# Patient Record
Sex: Female | Born: 1990
Health system: Southern US, Community
[De-identification: ages and names within clinical notes are randomized; demographics above are authoritative.]

## PROBLEM LIST (undated history)

## (undated) DIAGNOSIS — L309 Dermatitis, unspecified: Secondary | ICD-10-CM

## (undated) DIAGNOSIS — N979 Female infertility, unspecified: Secondary | ICD-10-CM

## (undated) DIAGNOSIS — F419 Anxiety disorder, unspecified: Secondary | ICD-10-CM

## (undated) DIAGNOSIS — L709 Acne, unspecified: Secondary | ICD-10-CM

## (undated) DIAGNOSIS — R030 Elevated blood-pressure reading, without diagnosis of hypertension: Secondary | ICD-10-CM

## (undated) DIAGNOSIS — Z973 Presence of spectacles and contact lenses: Secondary | ICD-10-CM

## (undated) DIAGNOSIS — T7840XA Allergy, unspecified, initial encounter: Secondary | ICD-10-CM

## (undated) DIAGNOSIS — E0590 Thyrotoxicosis, unspecified without thyrotoxic crisis or storm: Secondary | ICD-10-CM

## (undated) DIAGNOSIS — R009 Unspecified abnormalities of heart beat: Secondary | ICD-10-CM

## (undated) DIAGNOSIS — E282 Polycystic ovarian syndrome: Secondary | ICD-10-CM

## (undated) DIAGNOSIS — D649 Anemia, unspecified: Secondary | ICD-10-CM

## (undated) DIAGNOSIS — N809 Endometriosis, unspecified: Secondary | ICD-10-CM

## (undated) DIAGNOSIS — Z789 Other specified health status: Secondary | ICD-10-CM

## (undated) HISTORY — DX: Polycystic ovarian syndrome: E28.2

## (undated) HISTORY — DX: Endometriosis, unspecified: N80.9

## (undated) HISTORY — DX: Allergy, unspecified, initial encounter: T78.40XA

## (undated) HISTORY — DX: Dermatitis, unspecified: L30.9

## (undated) HISTORY — DX: Acne, unspecified: L70.9

---
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
## 2011-04-01 LAB — INFLUENZA A AND B ANTIGEN (CONVERTED LAB)
Inflenza A Ag: POSITIVE — AB
Influenza B Ag: NEGATIVE

## 2011-04-01 LAB — POCT RAPID STREP A: Streptococcus, Group A Screen (Direct): NEGATIVE

## 2011-04-21 LAB — POCT INFECTIOUS MONO SCREEN: Mono Screen: NEGATIVE

## 2011-04-21 LAB — POCT RAPID STREP A: Streptococcus, Group A Screen (Direct): NEGATIVE

## 2011-04-28 ENCOUNTER — Encounter: Payer: Self-pay | Admitting: Family Medicine

## 2012-04-06 ENCOUNTER — Encounter (HOSPITAL_COMMUNITY): Payer: Self-pay | Admitting: Emergency Medicine

## 2012-04-06 ENCOUNTER — Emergency Department (HOSPITAL_COMMUNITY): Payer: Managed Care, Other (non HMO)

## 2012-04-06 ENCOUNTER — Emergency Department (HOSPITAL_COMMUNITY)
Admission: EM | Admit: 2012-04-06 | Discharge: 2012-04-07 | Disposition: A | Discharge status: Home / Self Care | Payer: Managed Care, Other (non HMO) | Attending: Emergency Medicine | Admitting: Emergency Medicine

## 2012-04-06 DIAGNOSIS — A499 Bacterial infection, unspecified: Secondary | ICD-10-CM | POA: Insufficient documentation

## 2012-04-06 DIAGNOSIS — R109 Unspecified abdominal pain: Secondary | ICD-10-CM | POA: Insufficient documentation

## 2012-04-06 DIAGNOSIS — N760 Acute vaginitis: Secondary | ICD-10-CM | POA: Insufficient documentation

## 2012-04-06 DIAGNOSIS — B9689 Other specified bacterial agents as the cause of diseases classified elsewhere: Secondary | ICD-10-CM | POA: Insufficient documentation

## 2012-04-06 LAB — URINALYSIS, ROUTINE W REFLEX MICROSCOPIC
Bilirubin Urine: NEGATIVE
Glucose, UA: NEGATIVE mg/dL
Ketones, ur: NEGATIVE mg/dL
Leukocytes, UA: NEGATIVE
Nitrite: NEGATIVE
Protein, ur: NEGATIVE mg/dL
Specific Gravity, Urine: 1.025 (ref 1.005–1.030)
Urobilinogen, UA: 0.2 mg/dL (ref 0.0–1.0)
pH: 7 (ref 5.0–8.0)

## 2012-04-06 LAB — WET PREP, GENITAL
Trich, Wet Prep: NONE SEEN
Yeast Wet Prep HPF POC: NONE SEEN

## 2012-04-06 LAB — COMPREHENSIVE METABOLIC PANEL
ALT: 8 U/L (ref 0–35)
AST: 15 U/L (ref 0–37)
Albumin: 4.5 g/dL (ref 3.5–5.2)
Alkaline Phosphatase: 76 U/L (ref 39–117)
BUN: 12 mg/dL (ref 6–23)
CO2: 25 mEq/L (ref 19–32)
Calcium: 9.7 mg/dL (ref 8.4–10.5)
Chloride: 103 mEq/L (ref 96–112)
Creatinine, Ser: 0.76 mg/dL (ref 0.50–1.10)
GFR calc Af Amer: >90 mL/min (ref >90)
GFR calc non Af Amer: >90 mL/min (ref >90)
Glucose, Bld: 102 mg/dL — ABNORMAL HIGH (ref 70–99)
Potassium: 3.7 mEq/L (ref 3.5–5.1)
Sodium: 139 mEq/L (ref 135–145)
Total Bilirubin: 0.2 mg/dL — ABNORMAL LOW (ref 0.3–1.2)
Total Protein: 7.8 g/dL (ref 6.0–8.3)

## 2012-04-06 LAB — POCT PREGNANCY, URINE: Preg Test, Ur: NEGATIVE

## 2012-04-06 LAB — CBC WITH DIFFERENTIAL/PLATELET
Basophils Absolute: 0 10*3/uL (ref 0.0–0.1)
Basophils Relative: 0 % (ref 0–1)
Eosinophils Absolute: 0.1 10*3/uL (ref 0.0–0.7)
Eosinophils Relative: 1 % (ref 0–5)
HCT: 38.3 % (ref 36.0–46.0)
Hemoglobin: 13.1 g/dL (ref 12.0–15.0)
Lymphocytes Relative: 27 % (ref 12–46)
Lymphs Abs: 2.2 10*3/uL (ref 0.7–4.0)
MCH: 29.7 pg (ref 26.0–34.0)
MCHC: 34.2 g/dL (ref 30.0–36.0)
MCV: 86.8 fL (ref 78.0–100.0)
Monocytes Absolute: 0.7 10*3/uL (ref 0.1–1.0)
Monocytes Relative: 9 % (ref 3–12)
Neutro Abs: 5.1 10*3/uL (ref 1.7–7.7)
Neutrophils Relative %: 63 % (ref 43–77)
Platelets: 285 10*3/uL (ref 150–400)
RBC: 4.41 MIL/uL (ref 3.87–5.11)
RDW: 12.6 % (ref 11.5–15.5)
WBC: 8.1 10*3/uL (ref 4.0–10.5)

## 2012-04-06 LAB — URINE MICROSCOPIC-ADD ON

## 2012-04-06 MED ORDER — MORPHINE SULFATE 4 MG/ML IJ SOLN
4.0000 mg | Freq: Once | INTRAMUSCULAR | Status: AC
  Administered 2012-04-06: 4 mg via INTRAVENOUS
  Filled 2012-04-06: qty 1

## 2012-04-06 MED ORDER — ONDANSETRON HCL 4 MG PO TABS: 4.0000 mg | ORAL_TABLET | Freq: Four times a day (QID) | ORAL | Status: DC

## 2012-04-06 MED ORDER — ONDANSETRON HCL 4 MG/2ML IJ SOLN
4.0000 mg | Freq: Once | INTRAMUSCULAR | Status: AC
  Administered 2012-04-06: 4 mg via INTRAVENOUS
  Filled 2012-04-06: qty 2

## 2012-04-06 MED ORDER — IOHEXOL 300 MG/ML  SOLN
20.0000 mL | INTRAMUSCULAR | Status: AC
  Administered 2012-04-06: 20 mL via ORAL

## 2012-04-06 MED ORDER — SODIUM CHLORIDE 0.9 % IV BOLUS (SEPSIS)
1000.0000 mL | Freq: Once | INTRAVENOUS | Status: AC
  Administered 2012-04-06: 1000 mL via INTRAVENOUS

## 2012-04-06 MED ORDER — IOHEXOL 300 MG/ML  SOLN
80.0000 mL | Freq: Once | INTRAMUSCULAR | Status: AC | PRN
  Administered 2012-04-06: 80 mL via INTRAVENOUS

## 2012-04-06 MED ORDER — OXYCODONE-ACETAMINOPHEN 5-325 MG PO TABS: 1.0000 | ORAL_TABLET | Freq: Four times a day (QID) | ORAL | Status: DC | PRN

## 2012-04-06 MED ORDER — METRONIDAZOLE 500 MG PO TABS: 500.0000 mg | ORAL_TABLET | Freq: Two times a day (BID) | ORAL | Status: DC

## 2012-04-06 NOTE — ED Provider Notes (Signed)
ot placed in CDU to await CT scan of abdomen pelvic for evaluation of RLQ abdominal pain with acute onset. She had pelvic exam done which their was some mild adnexal tenderness on the right side, therefore, pelvic US done. Results were normal, CT scan added on to r/o appendicitis. CT scan also normal.  Pt had few clue cels. Will treat with flagyl, give pain and nausea medication and refer back to PCP.  Results for orders placed during the hospital encounter of 04/06/12  CBC WITH DIFFERENTIAL      Component Value Range   WBC 8.1  4.0 - 10.5 K/uL   RBC 4.41  3.87 - 5.11 MIL/uL   Hemoglobin 13.1  12.0 - 15.0 g/dL   HCT 69.6  29.5 - 28.4 %   MCV 86.8  78.0 - 100.0 fL   MCH 29.7  26.0 - 34.0 pg   MCHC 34.2  30.0 - 36.0 g/dL   RDW 13.2  44.0 - 10.2 %   Platelets 285  150 - 400 K/uL   Neutrophils Relative 63  43 - 77 %   Neutro Abs 5.1  1.7 - 7.7 K/uL   Lymphocytes Relative 27  12 - 46 %   Lymphs Abs 2.2  0.7 - 4.0 K/uL   Monocytes Relative 9  3 - 12 %   Monocytes Absolute 0.7  0.1 - 1.0 K/uL   Eosinophils Relative 1  0 - 5 %   Eosinophils Absolute 0.1  0.0 - 0.7 K/uL   Basophils Relative 0  0 - 1 %   Basophils Absolute 0.0  0.0 - 0.1 K/uL  COMPREHENSIVE METABOLIC PANEL      Component Value Range   Sodium 139  135 - 145 mEq/L   Potassium 3.7  3.5 - 5.1 mEq/L   Chloride 103  96 - 112 mEq/L   CO2 25  19 - 32 mEq/L   Glucose, Bld 102 (*) 70 - 99 mg/dL   BUN 12  6 - 23 mg/dL   Creatinine, Ser 7.25  0.50 - 1.10 mg/dL   Calcium 9.7  8.4 - 36.6 mg/dL   Total Protein 7.8  6.0 - 8.3 g/dL   Albumin 4.5  3.5 - 5.2 g/dL   AST 15  0 - 37 U/L   ALT 8  0 - 35 U/L   Alkaline Phosphatase 76  39 - 117 U/L   Total Bilirubin 0.2 (*) 0.3 - 1.2 mg/dL   GFR calc non Af Amer >90  >90 mL/min   GFR calc Af Amer >90  >90 mL/min  URINALYSIS, ROUTINE W REFLEX MICROSCOPIC      Component Value Range   Color, Urine YELLOW  YELLOW   APPearance CLEAR  CLEAR   Specific Gravity, Urine 1.025  1.005 - 1.030   pH  7.0  5.0 - 8.0   Glucose, UA NEGATIVE  NEGATIVE mg/dL   Hgb urine dipstick TRACE (*) NEGATIVE   Bilirubin Urine NEGATIVE  NEGATIVE   Ketones, ur NEGATIVE  NEGATIVE mg/dL   Protein, ur NEGATIVE  NEGATIVE mg/dL   Urobilinogen, UA 0.2  0.0 - 1.0 mg/dL   Nitrite NEGATIVE  NEGATIVE   Leukocytes, UA NEGATIVE  NEGATIVE  POCT PREGNANCY, URINE      Component Value Range   Preg Test, Ur NEGATIVE  NEGATIVE  URINE MICROSCOPIC-ADD ON      Component Value Range   Squamous Epithelial / LPF FEW (*) RARE   WBC, UA 0-2  <3 WBC/hpf  RBC / HPF 0-2  <3 RBC/hpf   Bacteria, UA FEW (*) RARE   Urine-Other MUCOUS PRESENT    WET PREP, GENITAL      Component Value Range   Yeast Wet Prep HPF POC NONE SEEN  NONE SEEN   Trich, Wet Prep NONE SEEN  NONE SEEN   Clue Cells Wet Prep HPF POC FEW (*) NONE SEEN   WBC, Wet Prep HPF POC FEW (*) NONE SEEN   US Pelvis Complete  04/06/2012  *RADIOLOGY REPORT*  Clinical Data:  Lower pelvic pain.  Evaluate for ovarian torsion.  TRANSABDOMINAL AND TRANSVAGINAL ULTRASOUND OF PELVIS DOPPLER ULTRASOUND OF OVARIES  Technique:  Both transabdominal and transvaginal ultrasound examinations of the pelvis were performed. Transabdominal technique was performed for global imaging of the pelvis including uterus, ovaries, adnexal regions, and pelvic cul-de-sac.  It was necessary to proceed with endovaginal exam following the transabdominal exam to visualize the uterus, endometrium and ovaries.  Color and duplex Doppler ultrasound was utilized to evaluate blood flow to the ovaries.  Comparison:  12/10/2004.  Findings:  Uterus:  Normal in size and echotexture measuring 5.9 x 3.3 x 3.7 cm.  Endometrium:  Normal in appearance measuring 10 mm in thickness.  Right ovary: Normal in size and appearance measuring 3.6 x 2.4 x 2.7 cm. Multiple normal appearing follicles.  No ovarian mass.  Left ovary:    Normal in size and appearance measuring 4.1 x 2.2 x 2.9 cm.  Multiple normal-appearing follicles.  No  ovarian mass.  Pulsed Doppler evaluation demonstrates normal low-resistance arterial and venous waveforms in both ovaries.  IMPRESSION: Normal exam.  No evidence of pelvic mass or other significant abnormality.  No sonographic evidence for ovarian torsion.   Original Report Authenticated By: Florencia Reasons, M.D.    Ct Abdomen Pelvis W Contrast  04/06/2012  *RADIOLOGY REPORT*  Clinical Data: 21 year old female with right lower quadrant abdominal pain, right low back pain.  CT ABDOMEN AND PELVIS WITH CONTRAST  Technique:  Multidetector CT imaging of the abdomen and pelvis was performed following the standard protocol during bolus administration of intravenous contrast.  Contrast: 80mL OMNIPAQUE IOHEXOL 300 MG/ML  SOLN  Comparison: Pelvic ultrasound with Doppler from earlier the same day.  CT abdomen pelvis 11/03/2004.  Findings: Minor atelectasis at the lung bases.  No pericardial or pleural effusion. No acute osseous abnormality identified.  No pelvic free fluid.  Uterus and adnexa are within normal limits. Unremarkable bladder.  Negative distal colon except for redundancy. Occasional diverticula at the distal descending colon.  Otherwise the left colon is decompressed.  Oral contrast has not reached the terminal ileum.  The right colon and appendix are normal.  The appendix descends from the level of the cecum ventral to the IVC. Terminal ileum within normal limits.  No dilated small bowel. Negative stomach, duodenum, liver, gallbladder, spleen, pancreas, adrenal glands, kidneys and portal venous system.  Major arterial structures are patent.  No abdominal free fluid.  No stranding in the abdomen. No lymphadenopathy.  IMPRESSION: Normal appendix.  No abnormality identified in the abdomen or pelvis.   Original Report Authenticated By: Harley Hallmark, M.D.     Pt has been advised of the symptoms that warrant their return to the ED. Patient has voiced understanding and has agreed to follow-up with the PCP or  specialist.    Dorthula Matas, PA 04/06/12 2346

## 2012-04-06 NOTE — ED Notes (Signed)
Updated pt/family on longest wait time in department.  Advised pt to notify this RN of any change in condition or needs.

## 2012-04-06 NOTE — ED Notes (Signed)
Onset today LLQ and RLQ pain radiating to bilateral flank pain. Denies dysuria.  Pain 7-8/10 cramping.  Denies nausea, vomiting, or diarrhea.  History of ovarian cyst.

## 2012-04-06 NOTE — ED Provider Notes (Signed)
History     CSN: [id number]  Arrival date & time 04/06/12  1402   First MD Initiated Contact with Patient 04/06/12 1717      Chief Complaint  Patient presents with  . Abdominal Pain  . Back Pain    (Consider location/radiation/quality/duration/timing/severity/associated sxs/prior treatment) Patient is a 21 y.o. female presenting with abdominal pain.  Abdominal Pain The primary symptoms of the illness include abdominal pain, nausea, vaginal discharge and vaginal bleeding. The primary symptoms of the illness do not include fever, fatigue, shortness of breath, vomiting, diarrhea, hematemesis, hematochezia or dysuria. The current episode started 1 to 2 hours ago. The onset of the illness was sudden. The problem has not changed since onset. The vaginal discharge is not associated with dysuria.   The illness is associated with recent sexual activity. The patient states that she believes she is currently not pregnant. The patient has not had a change in bowel habit. Symptoms associated with the illness do not include chills, anorexia, diaphoresis, heartburn, constipation, urgency, hematuria, frequency or back pain. Significant associated medical issues do not include PUD, GERD, inflammatory bowel disease, diabetes, sickle cell disease, gallstones, liver disease, substance abuse, diverticulitis, HIV or cardiac disease.   Presents with sudden onset RLQ pain and nausea. Patient was feeling fine prior to onset of symptoms. HAs noted some vaginal discharge and bleeding.   Past Medical History  Diagnosis Date  . Eczema   . Allergy     RHINITIS  . Acne     History reviewed. No pertinent past surgical history.  No family history on file.  History  Substance Use Topics  . Smoking status: Never Smoker   . Smokeless tobacco: Not on file  . Alcohol Use: No    OB History    Grav Para Term Preterm Abortions TAB SAB Ect Mult Living                  Review of Systems  Constitutional:  Negative for fever, chills, diaphoresis, activity change, appetite change and fatigue.  HENT: Negative for ear pain, congestion, rhinorrhea and neck pain.   Eyes: Negative for pain.  Respiratory: Negative for cough and shortness of breath.   Cardiovascular: Negative for chest pain and palpitations.  Gastrointestinal: Positive for nausea and abdominal pain. Negative for heartburn, vomiting, diarrhea, constipation, hematochezia, anorexia and hematemesis.  Genitourinary: Positive for vaginal bleeding and vaginal discharge. Negative for dysuria, urgency, frequency, hematuria, difficulty urinating and pelvic pain.  Musculoskeletal: Negative for back pain.  Skin: Negative for rash and wound.  Neurological: Negative for weakness and headaches.  Psychiatric/Behavioral: Negative for behavioral problems, confusion and agitation.    Allergies  Vicodin  Home Medications   Current Outpatient Rx  Name Route Sig Dispense Refill  . TYLENOL PO Oral Take 2 tablets by mouth 2 (two) times daily as needed. For pain      BP 124/77  Pulse 84  Temp 97.9 F (36.6 C) (Oral)  Resp 18  SpO2 100%  Physical Exam  Constitutional: She is oriented to person, place, and time. She appears well-developed and well-nourished. No distress.  HENT:  Head: Normocephalic and atraumatic.  Nose: Nose normal.  Mouth/Throat: Oropharynx is clear and moist.  Eyes: EOM are normal. Pupils are equal, round, and reactive to light.  Neck: Normal range of motion. Neck supple. No tracheal deviation present.  Cardiovascular: Normal rate, regular rhythm, normal heart sounds and intact distal pulses.   Pulmonary/Chest: Effort normal and breath sounds normal. She has no  rales.  Abdominal: Soft. Bowel sounds are normal. She exhibits no distension. There is tenderness (Worse RLQ). There is no rebound and no guarding.  Musculoskeletal: Normal range of motion. She exhibits no tenderness.  Neurological: She is alert and oriented to  person, place, and time.  Skin: Skin is warm and dry. No rash noted.  Psychiatric: She has a normal mood and affect. Her behavior is normal.    ED Course  Procedures (including critical care time)  Labs Reviewed  COMPREHENSIVE METABOLIC PANEL - Abnormal; Notable for the following:    Glucose, Bld 102 (*)     Total Bilirubin 0.2 (*)     All other components within normal limits  URINALYSIS, ROUTINE W REFLEX MICROSCOPIC - Abnormal; Notable for the following:    Hgb urine dipstick TRACE (*)     All other components within normal limits  URINE MICROSCOPIC-ADD ON - Abnormal; Notable for the following:    Squamous Epithelial / LPF FEW (*)     Bacteria, UA FEW (*)     All other components within normal limits  CBC WITH DIFFERENTIAL  POCT PREGNANCY, URINE  WET PREP, GENITAL  GC/CHLAMYDIA PROBE AMP, GENITAL   No results found.   1. BV (bacterial vaginosis)       MDM   Results for orders placed during the hospital encounter of 04/06/12  CBC WITH DIFFERENTIAL      Component Value Range   WBC 8.1  4.0 - 10.5 K/uL   RBC 4.41  3.87 - 5.11 MIL/uL   Hemoglobin 13.1  12.0 - 15.0 g/dL   HCT 16.1  09.6 - 04.5 %   MCV 86.8  78.0 - 100.0 fL   MCH 29.7  26.0 - 34.0 pg   MCHC 34.2  30.0 - 36.0 g/dL   RDW 40.9  81.1 - 91.4 %   Platelets 285  150 - 400 K/uL   Neutrophils Relative 63  43 - 77 %   Neutro Abs 5.1  1.7 - 7.7 K/uL   Lymphocytes Relative 27  12 - 46 %   Lymphs Abs 2.2  0.7 - 4.0 K/uL   Monocytes Relative 9  3 - 12 %   Monocytes Absolute 0.7  0.1 - 1.0 K/uL   Eosinophils Relative 1  0 - 5 %   Eosinophils Absolute 0.1  0.0 - 0.7 K/uL   Basophils Relative 0  0 - 1 %   Basophils Absolute 0.0  0.0 - 0.1 K/uL  COMPREHENSIVE METABOLIC PANEL      Component Value Range   Sodium 139  135 - 145 mEq/L   Potassium 3.7  3.5 - 5.1 mEq/L   Chloride 103  96 - 112 mEq/L   CO2 25  19 - 32 mEq/L   Glucose, Bld 102 (*) 70 - 99 mg/dL   BUN 12  6 - 23 mg/dL   Creatinine, Ser 7.82  0.50 -  1.10 mg/dL   Calcium 9.7  8.4 - 95.6 mg/dL   Total Protein 7.8  6.0 - 8.3 g/dL   Albumin 4.5  3.5 - 5.2 g/dL   AST 15  0 - 37 U/L   ALT 8  0 - 35 U/L   Alkaline Phosphatase 76  39 - 117 U/L   Total Bilirubin 0.2 (*) 0.3 - 1.2 mg/dL   GFR calc non Af Amer >90  >90 mL/min   GFR calc Af Amer >90  >90 mL/min  URINALYSIS, ROUTINE W REFLEX MICROSCOPIC  Component Value Range   Color, Urine YELLOW  YELLOW   APPearance CLEAR  CLEAR   Specific Gravity, Urine 1.025  1.005 - 1.030   pH 7.0  5.0 - 8.0   Glucose, UA NEGATIVE  NEGATIVE mg/dL   Hgb urine dipstick TRACE (*) NEGATIVE   Bilirubin Urine NEGATIVE  NEGATIVE   Ketones, ur NEGATIVE  NEGATIVE mg/dL   Protein, ur NEGATIVE  NEGATIVE mg/dL   Urobilinogen, UA 0.2  0.0 - 1.0 mg/dL   Nitrite NEGATIVE  NEGATIVE   Leukocytes, UA NEGATIVE  NEGATIVE  POCT PREGNANCY, URINE      Component Value Range   Preg Test, Ur NEGATIVE  NEGATIVE  URINE MICROSCOPIC-ADD ON      Component Value Range   Squamous Epithelial / LPF FEW (*) RARE   WBC, UA 0-2  <3 WBC/hpf   RBC / HPF 0-2  <3 RBC/hpf   Bacteria, UA FEW (*) RARE   Urine-Other MUCOUS PRESENT         US Pelvis Complete (Final result)   Result time:04/06/12 2034    Final result by Rad Results In Interface (04/06/12 20:34:08)    Narrative:   *RADIOLOGY REPORT*  Clinical Data: Lower pelvic pain. Evaluate for ovarian torsion.  TRANSABDOMINAL AND TRANSVAGINAL ULTRASOUND OF PELVIS DOPPLER ULTRASOUND OF OVARIES  Technique: Both transabdominal and transvaginal ultrasound examinations of the pelvis were performed. Transabdominal technique was performed for global imaging of the pelvis including uterus, ovaries, adnexal regions, and pelvic cul-de-sac.  It was necessary to proceed with endovaginal exam following the transabdominal exam to visualize the uterus, endometrium and ovaries.  Color and duplex Doppler ultrasound was utilized to evaluate blood flow to the ovaries.  Comparison:  12/10/2004.  Findings:  Uterus: Normal in size and echotexture measuring 5.9 x 3.3 x 3.7 cm.  Endometrium: Normal in appearance measuring 10 mm in thickness.  Right ovary: Normal in size and appearance measuring 3.6 x 2.4 x 2.7 cm. Multiple normal appearing follicles. No ovarian mass.  Left ovary: Normal in size and appearance measuring 4.1 x 2.2 x 2.9 cm. Multiple normal-appearing follicles. No ovarian mass.  Pulsed Doppler evaluation demonstrates normal low-resistance arterial and venous waveforms in both ovaries.  IMPRESSION: Normal exam. No evidence of pelvic mass or other significant abnormality.  No sonographic evidence for ovarian torsion.   Original Report Authenticated By: Florencia Reasons, M.D.          21 yo F in in no acute distress, afebrile, vital signs stable, non toxic appearing who presents with  RLQ abdominal pain. Hx concerning for ovarian torsion. US demonstrated good flow to bilateral ovaries.  Pelvic exam with no cervical motion tenderness. Patient currently in menses. Doubt PID.  No UTI on UA. No RBC and normal CRT doubt obstructed renal stone as source of pain. Given continued pain CT ordered. Discussed case with Marlon Pel PA patient accepted to CDU. At time of transfer awaiting scan.     Nadara Mustard, MD 04/06/12 [phone]

## 2012-04-06 NOTE — ED Provider Notes (Addendum)
21 year old female had sudden onset of suprapubic pain radiating to the back. This occurred this afternoon. She's currently on her menses. On exam, there is mild to moderate suprapubic tenderness without rebound or guarding. Peristalsis is decreased. No other abdominal tenderness is elicited. She was sent for ultrasound to rule out ovarian torsion and this was negative. CT scan has been ordered.  Dione Booze, MD 04/06/12 2053  I saw and evaluated the patient, reviewed the resident's note and I agree with the findings and plan.  Dione Booze, MD 04/07/12 [phone]

## 2012-04-06 NOTE — ED Notes (Signed)
Updated on wait times.  Advised to notify if any change in condition or needs.

## 2012-04-06 NOTE — ED Notes (Signed)
Patient states pain improved post medication, MD at bedside at this time for pelvic exam.

## 2012-04-07 LAB — GC/CHLAMYDIA PROBE AMP, GENITAL
Chlamydia, DNA Probe: NEGATIVE
GC Probe Amp, Genital: NEGATIVE

## 2012-04-09 ENCOUNTER — Ambulatory Visit (HOSPITAL_COMMUNITY): Payer: Self-pay

## 2012-04-10 ENCOUNTER — Ambulatory Visit (HOSPITAL_COMMUNITY): Payer: Self-pay

## 2014-03-16 ENCOUNTER — Emergency Department (HOSPITAL_COMMUNITY)
Admission: EM | Admit: 2014-03-16 | Discharge: 2014-03-16 | Disposition: A | Discharge status: Home / Self Care | Payer: BC Managed Care – PPO | Source: Home / Self Care

## 2014-03-16 ENCOUNTER — Encounter (HOSPITAL_COMMUNITY): Payer: Self-pay | Admitting: Emergency Medicine

## 2014-03-16 DIAGNOSIS — M26629 Arthralgia of temporomandibular joint, unspecified side: Secondary | ICD-10-CM

## 2014-03-16 DIAGNOSIS — K1120 Sialoadenitis, unspecified: Secondary | ICD-10-CM

## 2014-03-16 MED ORDER — AMOXICILLIN-POT CLAVULANATE 875-125 MG PO TABS: 1.0000 | ORAL_TABLET | Freq: Two times a day (BID) | ORAL | Status: DC

## 2014-03-16 MED ORDER — OXYCODONE-ACETAMINOPHEN 5-325 MG PO TABS: 1.0000 | ORAL_TABLET | ORAL | Status: DC | PRN

## 2014-03-16 NOTE — ED Provider Notes (Signed)
CSN: [id number]     Arrival date & time 03/16/14  1520 History   First MD Initiated Contact with Patient 03/16/14 1534     Chief Complaint  Patient presents with  . Jaw Pain   (Consider location/radiation/quality/duration/timing/severity/associated sxs/prior Treatment) HPI Comments: 23 year old female complaining of pain to the right TMJ and face for 2 days. Insidious onset and gradually worsening. There is pain when opening the jaw and while chewing. Denies trauma. Denies toothache.   Past Medical History  Diagnosis Date  . Eczema   . Allergy     RHINITIS  . Acne    History reviewed. No pertinent past surgical history. No family history on file. History  Substance Use Topics  . Smoking status: Never Smoker   . Smokeless tobacco: Not on file  . Alcohol Use: No   OB History   Grav Para Term Preterm Abortions TAB SAB Ect Mult Living                 Review of Systems  Constitutional: Positive for activity change. Negative for fever and fatigue.  HENT: Positive for facial swelling. Negative for congestion, drooling, ear discharge, ear pain, hearing loss, postnasal drip, rhinorrhea, sinus pressure, sneezing, sore throat, tinnitus and trouble swallowing.   Eyes: Negative.   Respiratory: Positive for shortness of breath. Negative for cough.   Skin: Negative.   Neurological: Negative.     Allergies  Vicodin  Home Medications   Prior to Admission medications   Medication Sig Start Date End Date Taking? Authorizing Provider  Acetaminophen (TYLENOL PO) Take 2 tablets by mouth 2 (two) times daily as needed. For pain    Historical Provider, MD  amoxicillin-clavulanate (AUGMENTIN) 875-125 MG per tablet Take 1 tablet by mouth every 12 (twelve) hours. 03/16/14   Hayden Rasmussen, NP  metroNIDAZOLE (FLAGYL) 500 MG tablet Take 1 tablet (500 mg total) by mouth 2 (two) times daily. 04/06/12   Tiffany Irine Seal, PA-C  ondansetron (ZOFRAN) 4 MG tablet Take 1 tablet (4 mg total) by mouth every 6  (six) hours. 04/06/12   Tiffany Irine Seal, PA-C  oxyCODONE-acetaminophen (PERCOCET/ROXICET) 5-325 MG per tablet Take 1 tablet by mouth every 6 (six) hours as needed for pain. 04/06/12   Tiffany Irine Seal, PA-C  oxyCODONE-acetaminophen (PERCOCET/ROXICET) 5-325 MG per tablet Take 1 tablet by mouth every 4 (four) hours as needed for severe pain. 03/16/14   Hayden Rasmussen, NP   BP 117/77  Pulse 67  Temp(Src) 99.5 F (37.5 C) (Oral)  Resp 16  SpO2 100%  LMP 03/09/2014 Physical Exam  Nursing note and vitals reviewed. Constitutional: She is oriented to person, place, and time. She appears well-developed and well-nourished. No distress.  HENT:  Head: Normocephalic and atraumatic.  Mouth/Throat: Oropharynx is clear and moist. No oropharyngeal exudate.  Bilateral TMs and EACs are clear and normal Oropharynx is normal. No erythema or swelling. A dental tenderness. No gingival swelling. No abscess formation. Minor facial swelling. No erythema or cellulitis. Isolation of the right buccal mucosa and cheek and palp taking this area produces tenderness. There is a small punctate lesion in the center of mild swelling and erythema. This is over the parotid meatus. There is also tenderness over the pterygoid muscle.  Eyes: Conjunctivae and EOM are normal.  Neck: Normal range of motion. Neck supple.  Pulmonary/Chest: Effort normal. No respiratory distress.  Lymphadenopathy:    She has no cervical adenopathy.  Neurological: She is alert and oriented to person, place, and time. She exhibits  normal muscle tone.  Skin: Skin is warm. No rash noted.  Psychiatric: She has a normal mood and affect.    ED Course  Procedures (including critical care time) Labs Review Labs Reviewed - No data to display  Imaging Review No results found.   MDM   1. Sialadenitis   2. Temporomandibular joint (TMJ) pain     augmentin Percocet #15 Motrin Lemon drops or juice Heat. May try ice if not helping.     Hayden Rasmussen,  NP 03/16/14 1558

## 2014-03-16 NOTE — ED Provider Notes (Signed)
Medical screening examination/treatment/procedure(s) were performed by resident physician or non-physician practitioner and as supervising physician I was immediately available for consultation/collaboration.   Barkley Bruns MD.   Linna Hoff, MD 03/16/14 [phone]

## 2014-03-16 NOTE — Discharge Instructions (Signed)
Salivary Gland Infection A salivary gland infection can be caused by a virus, bacteria from the mouth, or a stone. Mumps and other viruses may settle in one or more of the saliva glands. This will result in swelling, pain, and difficulty eating. Bacteria may cause a more severe infection in a salivary gland. A salivary stone blocking the flow of saliva can make this worse. These infections may be related to other medical problems. Some of these are dehydration, recent surgery, poor nutrition, and some medications. TREATMENT  Treatment of a salivary gland infection depends on the cause. Mumps and other virus infections do not require antibiotics. If bacteria cause the infection, then antibiotics are needed to get rid of the infection. If there is a salivary stone blocking the duct, minor surgery to remove the stone may be needed.  HOME CARE INSTRUCTIONS   Get plenty of rest, increase your fluids, and use warm compresses on the swollen area for 15 to 20 minutes 4 times per day or as often as feels good to you.  Suck on hard candy or chew sugarless gum to promote saliva production.  Only take over-the-counter or prescription medicines for pain, discomfort, or fever as directed by your caregiver. SEEK IMMEDIATE MEDICAL CARE IF:   You have increased swelling or pain or pain not relieved with medications.  You develop chills or a fever.  Any of your problems are getting worse rather than better. Document Released: 08/05/2004 Document Revised: 09/20/2011 Document Reviewed: 06/28/2005 [facility] Patient Information 2015 [place], [place]. This information is not intended to replace advice given to you by your health care provider. Make sure you discuss any questions you have with your health care provider.  Sialadenitis Sialadenitis is an inflammation (soreness) of the salivary glands. The parotid is the main salivary gland. It lies behind the angle of the jaw below the ear. The saliva produced comes out  of a tiny opening (duct) inside the cheek on either side. This is usually at the level of the upper back teeth. If it is swollen, the ear is pushed up and out. This helps tell this condition apart from a simple lymph gland infection (swollen glands) in the same area. Mumps has mostly disappeared since the start of immunization against mumps. Now the most common cause of parotitis is germ (bacterial) infection or inflammation of the lymphatics (the lymph channels). The other major salivary gland is located in the floor of the mouth. Smaller salivary glands are located in the mouth. This includes the:  Lips.  Lining of the mouth.  Pharynx.  Hard palate (front part of the roof of the mouth). The salivary glands do many things, including:  Lubrication.  Breaking down food.  Production of hormones and antibodies (to protect against germs which may cause illness).  Help with the sense of taste. ACUTE BACTERIAL SIALADENITIS This is a sudden inflammatory response to bacterial infection. This causes redness, pain, swelling and tenderness over the infected gland. In the past, it was common in dehydrated and debilitated patients often following an operation. It is now more commonly seen:  After radiotherapy.  In patients with poor immune systems. Treatment is:  The correction of fluid balance (rehydration).  Medicine that kill germs (antibiotics).  Pain relief. CHRONIC RECURRENT SIALADENITIS This refers to repeated episodes of discomfort and swelling of one of the salivary glands. It often occurs after eating. Chronic sialadenitis is usually less painful. It is associated with recurrent enlargement of a salivary gland, often following meals, and typically  with an absence of redness. The chronic form of the disease often is associated with conditions linked to decreased salivary flow, rather than dehydration (loss of body fluids). These conditions include:  A stone, or concretion, formed in the  gallbladder, kidneys, or other parts of the body (calculi).  Salivary stasis.  A change in the fluid and electrolyte (the salts in your body fluids) makeup of the gland. It is treated with:  Gland massage.  Methods to stimulate the flow of saliva, (for example, lemon juice).  Antibiotics if required. Surgery to remove the gland is possible, but its benefits need to be balanced against risks.  VIRAL SIALADENITIS Several viruses infect the salivary glands. Some of these include the mumps virus that commonly infects the parotid gland. Other viruses causing problems are:  The HIV virus.  Herpes.  Some of the influenza ("flu") viruses. RECURRENT SIALADENITIS IN CHILDREN This condition is thought to be due to swelling or ballooning of the ducts. It results in the same symptoms as acute bacterial parotitis. It is usually caused by germs (bacteria). It is often treated using penicillin. It may get well without treatment. Surgery is usually not required. TUBERCULOUS SIALADENITIS The salivary glands may become infected with the same bacteria causing tuberculosis ("TB"). Treatment is with anti-tuberculous antibiotic therapy. OTHER UNCOMMON CAUSES OF SIALADENITIS   Sjogren's syndrome is a condition in which arthritis is associated with a decrease in activity of the glands of the body that produce saliva and tears. The diagnosis is made with blood tests or by examination of a piece of tissue from the inside of the lip. Some people with this condition are bothered by:  A dry mouth.  Intermittent salivary gland enlargement.  Atypical mycobacteria is a germ similar to tuberculosis. It often infects children. It is often resistant to antibiotic treatment. It may require surgical treatment to remove the infected salivary gland.  Actinomycosis is an infection of the parotid gland that may also involve the overlying skin. The diagnosis is made by detecting granules of sulphur produced by the bacteria  on microscopic examination. Treatment is a prolonged course of penicillin for up to one year.  Nutritional causes include vitamin deficiencies and bulimia.  Diabetes and problems with your thyroid.  Obesity, cirrhosis, and malabsorption are some metabolic causes. HOME CARE INSTRUCTIONS   Try warm compresses first. If not helping apply ice bags every 2 hours for 15-20 minutes, while awake, to the sore gland for 24 hours, then as directed by your caregiver. Place the ice in a plastic bag with a towel around it to prevent frostbite to the skin.  Only take over-the-counter or prescription medicines for pain, discomfort, or fever as directed by your caregiver. SEEK IMMEDIATE MEDICAL CARE IF:   There is increased pain or swelling in your gland that is not controlled with medicine.  An oral temperature above 102 F (38.9 C) develops, not controlled by medicine.  You develop difficulty opening your mouth, swallowing, or speaking. Document Released: 12/18/2001 Document Revised: 09/20/2011 Document Reviewed: 02/12/2008 [facility] Patient Information 2015 [place], [place]. This information is not intended to replace advice given to you by your health care provider. Make sure you discuss any questions you have with your health care provider.

## 2014-03-16 NOTE — ED Notes (Signed)
C/o pain on right side of jaw onset 2 days; getting worse Denies inj/trauma; pain increases when opening mouth Alert, no signs of acute distress.

## 2014-04-18 ENCOUNTER — Encounter (HOSPITAL_COMMUNITY): Payer: Self-pay | Admitting: Emergency Medicine

## 2014-04-18 ENCOUNTER — Emergency Department (HOSPITAL_COMMUNITY)
Admission: EM | Admit: 2014-04-18 | Discharge: 2014-04-18 | Disposition: A | Discharge status: Home / Self Care | Payer: BC Managed Care – PPO | Source: Home / Self Care | Attending: Family Medicine | Admitting: Family Medicine

## 2014-04-18 DIAGNOSIS — M26609 Unspecified temporomandibular joint disorder, unspecified side: Secondary | ICD-10-CM

## 2014-04-18 DIAGNOSIS — M2660 Temporomandibular joint disorder, unspecified: Secondary | ICD-10-CM

## 2014-04-18 MED ORDER — DICLOFENAC SODIUM 75 MG PO TBEC: 75.0000 mg | DELAYED_RELEASE_TABLET | Freq: Two times a day (BID) | ORAL | Status: DC

## 2014-04-18 MED ORDER — PREDNISONE 50 MG PO TABS: ORAL_TABLET | ORAL | Status: DC

## 2014-04-18 NOTE — ED Provider Notes (Signed)
CSN: [id number]     Arrival date & time 04/18/14  1117 History   None    Chief Complaint  Patient presents with  . Follow-up    right sided jaw pain   (Consider location/radiation/quality/duration/timing/severity/associated sxs/prior Treatment) HPI  Here today for follow up from previous visit.  [facility] symptoms of TMJ and salivary gland inflammation  Given augmentin, percocet, motrin lemon drops, and heat.   Took Augmentin w/ improvement in pain, but the pain returned about 1 wk after stopping the medicine. Percocet w/ some improvement but makes pt tired. Pain is upper R jaw. Worse w/ chewing. Feels like jaw gets tired from chewing. Denies fevers, swelling of jaw, foul odor or discharge. Advil 400mg  Qday w/o benefit.     Past Medical History  Diagnosis Date  . Eczema   . Allergy     RHINITIS  . Acne    History reviewed. No pertinent past surgical history. History reviewed. No pertinent family history. History  Substance Use Topics  . Smoking status: Never Smoker   . Smokeless tobacco: Not on file  . Alcohol Use: No   OB History   Grav Para Term Preterm Abortions TAB SAB Ect Mult Living                 Review of Systems Per HPI with all other pertinent systems negative.   Allergies  Vicodin  Home Medications   Prior to Admission medications   Medication Sig Start Date End Date Taking? Authorizing Provider  Acetaminophen (TYLENOL PO) Take 2 tablets by mouth 2 (two) times daily as needed. For pain   Yes Historical Provider, MD  amoxicillin-clavulanate (AUGMENTIN) 875-125 MG per tablet Take 1 tablet by mouth every 12 (twelve) hours. 03/16/14  Yes Hayden Rasmussenavid Mabe, NP  diclofenac (VOLTAREN) 75 MG EC tablet Take 1 tablet (75 mg total) by mouth 2 (two) times daily. 04/18/14   Ozella Rocksavid J Merrell, MD  metroNIDAZOLE (FLAGYL) 500 MG tablet Take 1 tablet (500 mg total) by mouth 2 (two) times daily. 04/06/12   Tiffany Irine SealG Greene, PA-C  ondansetron (ZOFRAN) 4 MG tablet  Take 1 tablet (4 mg total) by mouth every 6 (six) hours. 04/06/12   Tiffany Irine SealG Greene, PA-C  oxyCODONE-acetaminophen (PERCOCET/ROXICET) 5-325 MG per tablet Take 1 tablet by mouth every 6 (six) hours as needed for pain. 04/06/12   Tiffany Irine SealG Greene, PA-C  oxyCODONE-acetaminophen (PERCOCET/ROXICET) 5-325 MG per tablet Take 1 tablet by mouth every 4 (four) hours as needed for severe pain. 03/16/14   Hayden Rasmussenavid Mabe, NP  predniSONE (DELTASONE) 50 MG tablet Take daily with breakfast 04/18/14   Ozella Rocksavid J Merrell, MD   BP 112/69  Pulse 73  Temp(Src) 98.3 F (36.8 C) (Oral)  Resp 14  SpO2 100%  LMP 03/19/2014 Physical Exam  Constitutional: She is oriented to person, place, and time. She appears well-developed and well-nourished. No distress.  HENT:  Head: Normocephalic and atraumatic.  Intermittent jaw popping on R. No edema, oropharynx nml w/o any parotid, submandibular, submental gland irritation or ttp. Cheeks and gums w/o ttp.   Eyes: EOM are normal. Pupils are equal, round, and reactive to light.  Neck: Normal range of motion. Neck supple.  Cardiovascular: Normal rate, normal heart sounds and intact distal pulses.   Pulmonary/Chest: Effort normal and breath sounds normal.  Abdominal: Soft.  Musculoskeletal: Normal range of motion.  Neurological: She is oriented to person, place, and time.  Skin: Skin is warm. She is  not diaphoretic.  Psychiatric: She has a normal mood and affect. Her behavior is normal. Thought content normal.    ED Course  Procedures (including critical care time) Labs Review Labs Reviewed - No data to display  Imaging Review No results found.   MDM   1. Temporomandibular joint dysfunction syndrome    Try 5 days prednisone and then Voltaren BID Also start TMJ exercises and consider buying mouth guard for sleep at night.  Possibly also grinding teeth at night.  No signs of gland inflammation or infection F/u PCP prn. Precautions given and all questions  answered   Shelly Flatten, MD Family Medicine 04/18/2014, 1:09 PM      Ozella Rocks, MD 04/18/14 1310

## 2014-04-18 NOTE — ED Notes (Signed)
Reports being seen on 9/5 for right sided jaw pain.  Pt presents today stating that a week after taking antibiotics pain gradually returned and is getting worse.  Pt states that she wears a retainer at night and is unsure if that is causing pain to return.    No relief with taking otc pain medication.

## 2014-04-18 NOTE — Discharge Instructions (Signed)
You are likely suffering from temporomandibular joint dysfunction This will likely come and go over the years Please consider a 5 day course of steroids Please start the voltaren after that time Please try jaw exercises and consider buying a TMJ specific mouth guard  Temporomandibular Problems  Temporomandibular joint (TMJ) dysfunction means there are problems with the joint between your jaw and your skull. This is a joint lined by cartilage like other joints in your body but also has a small disc in the joint which keeps the bones from rubbing on each other. These joints are like other joints and can get inflamed (sore) from arthritis and other problems. When this joint gets sore, it can cause headaches and pain in the jaw and the face. CAUSES  Usually the arthritic types of problems are caused by soreness in the joint. Soreness in the joint can also be caused by overuse. This may come from grinding your teeth. It may also come from mis-alignment in the joint. DIAGNOSIS Diagnosis of this condition can often be made by history and exam. Sometimes your caregiver may need X-rays or an MRI scan to determine the exact cause. It may be necessary to see your dentist to determine if your teeth and jaws are lined up correctly. TREATMENT  Most of the time this problem is not serious; however, sometimes it can persist (become chronic). When this happens medications that will cut down on inflammation (soreness) help. Sometimes a shot of cortisone into the joint will be helpful. If your teeth are not aligned it may help for your dentist to make a splint for your mouth that can help this problem. If no physical problems can be found, the problem may come from tension. If tension is found to be the cause, biofeedback or relaxation techniques may be helpful. HOME CARE INSTRUCTIONS   Later in the day, applications of ice packs may be helpful. Ice can be used in a plastic bag with a towel around it to prevent  frostbite to skin. This may be used about every 2 hours for 20 to 30 minutes, as needed while awake, or as directed by your caregiver.  Only take over-the-counter or prescription medicines for pain, discomfort, or fever as directed by your caregiver.  If physical therapy was prescribed, follow your caregiver's directions.  Wear mouth appliances as directed if they were given. Document Released: 03/23/2001 Document Revised: 09/20/2011 Document Reviewed: 06/30/2008 [facility] Patient Information 2015 [place]. This information is not intended to replace advice given to you by your health care provider. Make sure you discuss any questions you have with your health care provider.

## 2014-08-01 DIAGNOSIS — M26629 Arthralgia of temporomandibular joint, unspecified side: Secondary | ICD-10-CM | POA: Insufficient documentation

## 2014-09-24 ENCOUNTER — Encounter (HOSPITAL_COMMUNITY): Payer: Self-pay | Admitting: Emergency Medicine

## 2014-09-24 ENCOUNTER — Emergency Department (HOSPITAL_COMMUNITY)
Admission: EM | Admit: 2014-09-24 | Discharge: 2014-09-24 | Disposition: A | Discharge status: Home / Self Care | Payer: BLUE CROSS/BLUE SHIELD | Source: Home / Self Care | Attending: Family Medicine | Admitting: Family Medicine

## 2014-09-24 DIAGNOSIS — J069 Acute upper respiratory infection, unspecified: Secondary | ICD-10-CM

## 2014-09-24 MED ORDER — IPRATROPIUM BROMIDE 0.06 % NA SOLN: 2.0000 | Freq: Four times a day (QID) | NASAL | Status: DC

## 2014-09-24 MED ORDER — HYDROCOD POLST-CHLORPHEN POLST 10-8 MG/5ML PO LQCR: 5.0000 mL | Freq: Two times a day (BID) | ORAL | Status: DC | PRN

## 2014-09-24 NOTE — ED Notes (Signed)
Cough since last Wednesday.  Soreness in chest and back with coughing.  Reports coughing up some blood last Saturday.  Reports facial pain

## 2014-09-24 NOTE — ED Provider Notes (Signed)
CSN: [id number]     Arrival date & time 09/24/14  0840 History   First MD Initiated Contact with Patient 09/24/14 [phone]     Chief Complaint  Patient presents with  . Cough   (Consider location/radiation/quality/duration/timing/severity/associated sxs/prior Treatment) Patient is a 24 y.o. female presenting with cough. The history is provided by the patient.  Cough Cough characteristics:  Non-productive, dry, hoarse and hacking Severity:  Mild Onset quality:  Gradual Duration:  6 days Chronicity:  New Smoker: no   Context: upper respiratory infection   Ineffective treatments:  Cough suppressants Associated symptoms: chills, myalgias and rhinorrhea   Associated symptoms: no fever, no shortness of breath, no sore throat and no wheezing     Past Medical History  Diagnosis Date  . Eczema   . Allergy     RHINITIS  . Acne    History reviewed. No pertinent past surgical history. History reviewed. No pertinent family history. History  Substance Use Topics  . Smoking status: Never Smoker   . Smokeless tobacco: Not on file  . Alcohol Use: No   OB History    No data available     Review of Systems  Constitutional: Positive for chills. Negative for fever.  HENT: Positive for congestion, postnasal drip and rhinorrhea. Negative for sore throat.   Respiratory: Positive for cough. Negative for shortness of breath and wheezing.   Gastrointestinal: Negative.   Musculoskeletal: Positive for myalgias.    Allergies  Vicodin  Home Medications   Prior to Admission medications   Medication Sig Start Date End Date Taking? Authorizing Provider  Acetaminophen (TYLENOL PO) Take 2 tablets by mouth 2 (two) times daily as needed. For pain    Historical Provider, MD  amoxicillin-clavulanate (AUGMENTIN) 875-125 MG per tablet Take 1 tablet by mouth every 12 (twelve) hours. Patient not taking: Reported on 09/24/2014 03/16/14   Hayden Rasmussen, NP  chlorpheniramine-HYDROcodone [facility])  10-8 MG/5ML LQCR Take 5 mLs by mouth every 12 (twelve) hours as needed for cough. 09/24/14   Linna Hoff, MD  diclofenac (VOLTAREN) 75 MG EC tablet Take 1 tablet (75 mg total) by mouth 2 (two) times daily. Patient not taking: Reported on 09/24/2014 04/18/14   Ozella Rocks, MD  ipratropium (ATROVENT) 0.06 % nasal spray Place 2 sprays into both nostrils 4 (four) times daily. 09/24/14   Linna Hoff, MD  metroNIDAZOLE (FLAGYL) 500 MG tablet Take 1 tablet (500 mg total) by mouth 2 (two) times daily. Patient not taking: Reported on 09/24/2014 04/06/12   Marlon Pel, PA-C  ondansetron (ZOFRAN) 4 MG tablet Take 1 tablet (4 mg total) by mouth every 6 (six) hours. 04/06/12   Marlon Pel, PA-C  oxyCODONE-acetaminophen (PERCOCET/ROXICET) 5-325 MG per tablet Take 1 tablet by mouth every 6 (six) hours as needed for pain. Patient not taking: Reported on 09/24/2014 04/06/12   Marlon Pel, PA-C  oxyCODONE-acetaminophen (PERCOCET/ROXICET) 5-325 MG per tablet Take 1 tablet by mouth every 4 (four) hours as needed for severe pain. Patient not taking: Reported on 09/24/2014 03/16/14   Hayden Rasmussen, NP  predniSONE (DELTASONE) 50 MG tablet Take daily with breakfast Patient not taking: Reported on 09/24/2014 04/18/14   Ozella Rocks, MD   BP 123/83 mmHg  Pulse 103  Temp(Src) 98 F (36.7 C) (Oral)  Resp 14  SpO2 100%  LMP 09/07/2014 Physical Exam  Constitutional: She is oriented to person, place, and time. She appears well-developed and well-nourished. No distress.  HENT:  Head: Normocephalic.  Right  Ear: External ear normal.  Left Ear: External ear normal.  Mouth/Throat: Oropharynx is clear and moist. No oropharyngeal exudate.  Eyes: Conjunctivae are normal. Pupils are equal, round, and reactive to light.  Neck: Normal range of motion. Neck supple.  Pulmonary/Chest: Effort normal and breath sounds normal.  Lymphadenopathy:    She has no cervical adenopathy.  Neurological: She is alert and oriented to  person, place, and time.  Skin: Skin is warm and dry.  Nursing note and vitals reviewed.   ED Course  Procedures (including critical care time) Labs Review Labs Reviewed - No data to display  Imaging Review No results found.   MDM   1. URI (upper respiratory infection)       Linna HoffJames D Loan Oguin, MD 10/01/14 [phone]

## 2014-09-24 NOTE — Discharge Instructions (Signed)
Drink plenty of fluids as discussed, use medicine as prescribed, and mucinex or delsym for cough. Return or see your doctor if further problems °

## 2014-11-15 ENCOUNTER — Emergency Department (INDEPENDENT_AMBULATORY_CARE_PROVIDER_SITE_OTHER): Payer: BLUE CROSS/BLUE SHIELD

## 2014-11-15 ENCOUNTER — Emergency Department (HOSPITAL_COMMUNITY)
Admission: EM | Admit: 2014-11-15 | Discharge: 2014-11-15 | Disposition: A | Discharge status: Home / Self Care | Payer: BLUE CROSS/BLUE SHIELD | Source: Home / Self Care | Attending: Family Medicine | Admitting: Family Medicine

## 2014-11-15 ENCOUNTER — Encounter (HOSPITAL_COMMUNITY): Payer: Self-pay | Admitting: *Deleted

## 2014-11-15 DIAGNOSIS — R52 Pain, unspecified: Secondary | ICD-10-CM

## 2014-11-15 DIAGNOSIS — M7071 Other bursitis of hip, right hip: Secondary | ICD-10-CM | POA: Diagnosis not present

## 2014-11-15 MED ORDER — MELOXICAM 7.5 MG PO TABS: 7.5000 mg | ORAL_TABLET | Freq: Two times a day (BID) | ORAL | Status: DC

## 2014-11-15 NOTE — ED Notes (Signed)
Pt  Reports  r  Hip  Pain      X  3  Days   -  Pt  denys  Any  specefic     Injury    She  Ambulated to  Room  With a    Slow  Gait         Pt  denys  Any  Urinary  Problems   She   Is awake  Alert   And  Oriented  And  Her  Skin is  Warm and  Dry

## 2014-11-15 NOTE — ED Provider Notes (Signed)
CSN: [id number]     Arrival date & time 11/15/14  1537 History   First MD Initiated Contact with Patient 11/15/14 1637     Chief Complaint  Patient presents with  . Hip Pain   (Consider location/radiation/quality/duration/timing/severity/associated sxs/prior Treatment) Patient is a 24 y.o. female presenting with hip pain. The history is provided by the patient.  Hip Pain This is a new problem. The current episode started more than 2 days ago (nki ,unknown etiol of sx.). The problem has been gradually worsening. Pertinent negatives include no chest pain and no abdominal pain. The symptoms are aggravated by walking.    Past Medical History  Diagnosis Date  . Eczema   . Allergy     RHINITIS  . Acne    History reviewed. No pertinent past surgical history. History reviewed. No pertinent family history. History  Substance Use Topics  . Smoking status: Never Smoker   . Smokeless tobacco: Not on file  . Alcohol Use: No   OB History    No data available     Review of Systems  Constitutional: Negative.   Cardiovascular: Negative for chest pain.  Gastrointestinal: Negative for abdominal pain.  Musculoskeletal: Positive for gait problem. Negative for back pain and joint swelling.  Skin: Negative.     Allergies  Vicodin  Home Medications   Prior to Admission medications   Medication Sig Start Date End Date Taking? Authorizing Provider  Acetaminophen (TYLENOL PO) Take 2 tablets by mouth 2 (two) times daily as needed. For pain    Historical Provider, MD  amoxicillin-clavulanate (AUGMENTIN) 875-125 MG per tablet Take 1 tablet by mouth every 12 (twelve) hours. Patient not taking: Reported on 09/24/2014 03/16/14   Hayden Rasmussenavid Mabe, NP  chlorpheniramine-HYDROcodone [facility](TUSSIONEX PENNKINETIC ER) 10-8 MG/5ML LQCR Take 5 mLs by mouth every 12 (twelve) hours as needed for cough. 09/24/14   Linna HoffJames D Kamran Coker, MD  diclofenac (VOLTAREN) 75 MG EC tablet Take 1 tablet (75 mg total) by mouth 2 (two) times  daily. Patient not taking: Reported on 09/24/2014 04/18/14   Ozella Rocksavid J Merrell, MD  ipratropium (ATROVENT) 0.06 % nasal spray Place 2 sprays into both nostrils 4 (four) times daily. 09/24/14   Linna HoffJames D Belvia Gotschall, MD  meloxicam (MOBIC) 7.5 MG tablet Take 1 tablet (7.5 mg total) by mouth 2 (two) times daily after a meal. 11/15/14   Linna HoffJames D Teala Daffron, MD  metroNIDAZOLE (FLAGYL) 500 MG tablet Take 1 tablet (500 mg total) by mouth 2 (two) times daily. Patient not taking: Reported on 09/24/2014 04/06/12   Marlon Peliffany Greene, PA-C  ondansetron (ZOFRAN) 4 MG tablet Take 1 tablet (4 mg total) by mouth every 6 (six) hours. 04/06/12   Marlon Peliffany Greene, PA-C  oxyCODONE-acetaminophen (PERCOCET/ROXICET) 5-325 MG per tablet Take 1 tablet by mouth every 6 (six) hours as needed for pain. Patient not taking: Reported on 09/24/2014 04/06/12   Marlon Peliffany Greene, PA-C  oxyCODONE-acetaminophen (PERCOCET/ROXICET) 5-325 MG per tablet Take 1 tablet by mouth every 4 (four) hours as needed for severe pain. Patient not taking: Reported on 09/24/2014 03/16/14   Hayden Rasmussenavid Mabe, NP  predniSONE (DELTASONE) 50 MG tablet Take daily with breakfast Patient not taking: Reported on 09/24/2014 04/18/14   Ozella Rocksavid J Merrell, MD   BP 124/80 mmHg  Pulse 78  Temp(Src) 98.6 F (37 C) (Oral)  Resp 18  SpO2 100%  LMP 11/15/2014 Physical Exam  Constitutional: She is oriented to person, place, and time. She appears well-developed and well-nourished.  Musculoskeletal: She exhibits tenderness.  Right hip: She exhibits decreased range of motion, decreased strength, tenderness and bony tenderness.       Legs: Neurological: She is alert and oriented to person, place, and time.  Skin: Skin is warm and dry.  Nursing note and vitals reviewed.   ED Course  Procedures (including critical care time) Labs Review Labs Reviewed - No data to display  Imaging Review Dg Hip Unilat With Pelvis 2-3 Views Right  11/15/2014   CLINICAL DATA:  Right hip pain for the past 3 days. No  known injury.  EXAM: RIGHT HIP (WITH PELVIS) 2-3 VIEWS  COMPARISON:  None.  FINDINGS: There is no evidence of hip fracture or dislocation. There is no evidence of arthropathy or other focal bone abnormality.  IMPRESSION: Normal examination.   Electronically Signed   By: Beckie SaltsSteven  Reid M.D.   On: 11/15/2014 17:01   X-rays reviewed and report per radiologist.   MDM   1. Hip bursitis, right   2. Pain        Linna HoffJames D Renne Cornick, MD 11/15/14 [phone]

## 2014-11-15 NOTE — Discharge Instructions (Signed)
Ice pack and medicine as needed, see orthopedist if further problems

## 2015-09-11 ENCOUNTER — Other Ambulatory Visit: Payer: Self-pay | Admitting: Gynecology

## 2015-09-11 DIAGNOSIS — R922 Inconclusive mammogram: Secondary | ICD-10-CM

## 2015-09-12 ENCOUNTER — Ambulatory Visit
Admission: RE | Admit: 2015-09-12 | Discharge: 2015-09-12 | Disposition: A | Discharge status: Home / Self Care | Payer: 59 | Source: Ambulatory Visit | Attending: Gynecology | Admitting: Gynecology

## 2015-09-12 DIAGNOSIS — R922 Inconclusive mammogram: Secondary | ICD-10-CM

## 2016-01-07 ENCOUNTER — Telehealth: Payer: Self-pay | Admitting: Family Medicine

## 2016-01-07 NOTE — Telephone Encounter (Signed)
Pt called and wanted her immunization records printed out she is going to come and pick them up

## 2016-10-01 ENCOUNTER — Ambulatory Visit (INDEPENDENT_AMBULATORY_CARE_PROVIDER_SITE_OTHER): Payer: BC Managed Care – PPO | Admitting: Family Medicine

## 2016-10-01 ENCOUNTER — Encounter: Payer: Self-pay | Admitting: Family Medicine

## 2016-10-01 VITALS — BP 110/78 | HR 64 | Temp 98.2°F | Wt 199.4 lb

## 2016-10-01 DIAGNOSIS — R51 Headache: Secondary | ICD-10-CM

## 2016-10-01 DIAGNOSIS — N926 Irregular menstruation, unspecified: Secondary | ICD-10-CM

## 2016-10-01 DIAGNOSIS — R519 Headache, unspecified: Secondary | ICD-10-CM

## 2016-10-01 DIAGNOSIS — H5711 Ocular pain, right eye: Secondary | ICD-10-CM | POA: Diagnosis not present

## 2016-10-01 LAB — CBC WITH DIFFERENTIAL/PLATELET
Basophils Absolute: 0 cells/uL (ref 0–200)
Basophils Relative: 0 %
Eosinophils Absolute: 75 cells/uL (ref 15–500)
Eosinophils Relative: 1 %
HCT: 38 % (ref 35.0–45.0)
Hemoglobin: 12.6 g/dL (ref 11.7–15.5)
Lymphocytes Relative: 28 %
Lymphs Abs: 2100 cells/uL (ref 850–3900)
MCH: 29 pg (ref 27.0–33.0)
MCHC: 33.2 g/dL (ref 32.0–36.0)
MCV: 87.4 fL (ref 80.0–100.0)
MPV: 10.2 fL (ref 7.5–12.5)
Monocytes Absolute: 675 cells/uL (ref 200–950)
Monocytes Relative: 9 %
Neutro Abs: 4650 cells/uL (ref 1500–7800)
Neutrophils Relative %: 62 %
Platelets: 344 10*3/uL (ref 140–400)
RBC: 4.35 MIL/uL (ref 3.80–5.10)
RDW: 13.3 % (ref 11.0–15.0)
WBC: 7.5 10*3/uL (ref 4.0–10.5)

## 2016-10-01 LAB — COMPREHENSIVE METABOLIC PANEL
ALT: 13 U/L (ref 6–29)
AST: 18 U/L (ref 10–30)
Albumin: 4.5 g/dL (ref 3.6–5.1)
Alkaline Phosphatase: 81 U/L (ref 33–115)
BUN: 11 mg/dL (ref 7–25)
CO2: 28 mmol/L (ref 20–31)
Calcium: 9.6 mg/dL (ref 8.6–10.2)
Chloride: 103 mmol/L (ref 98–110)
Creat: 0.88 mg/dL (ref 0.50–1.10)
Glucose, Bld: 87 mg/dL (ref 65–99)
Potassium: 4.6 mmol/L (ref 3.5–5.3)
Sodium: 137 mmol/L (ref 135–146)
Total Bilirubin: 0.4 mg/dL (ref 0.2–1.2)
Total Protein: 7.3 g/dL (ref 6.1–8.1)

## 2016-10-01 LAB — POCT URINE PREGNANCY: Preg Test, Ur: NEGATIVE

## 2016-10-01 MED ORDER — VALACYCLOVIR HCL 1 G PO TABS: 1000.0000 mg | ORAL_TABLET | Freq: Three times a day (TID) | ORAL | 0 refills | Status: DC

## 2016-10-01 MED ORDER — IBUPROFEN 800 MG PO TABS: 800.0000 mg | ORAL_TABLET | Freq: Three times a day (TID) | ORAL | 0 refills | Status: DC | PRN

## 2016-10-01 MED ORDER — AMOXICILLIN-POT CLAVULANATE 875-125 MG PO TABS: 1.0000 | ORAL_TABLET | Freq: Two times a day (BID) | ORAL | 0 refills | Status: DC

## 2016-10-01 NOTE — Patient Instructions (Addendum)
Take the antibiotic until finished. This in for possible infection.   If you see a rash develop on your face then start the Valacyclovir.   For pain, take Ibuprofen 800 mg 2 or 3 times daily with food.   See your eye doctor as scheduled at noon today.   Call me Monday and let me know how you are doing.   Information is provided on shingles below in case.    Shingles Shingles, which is also known as herpes zoster, is an infection that causes a painful skin rash and fluid-filled blisters. Shingles is not related to genital herpes, which is a sexually transmitted infection. Shingles only develops in people who:  Have had chickenpox.  Have received the chickenpox vaccine. (This is rare.) What are the causes? Shingles is caused by varicella-zoster virus (VZV). This is the same virus that causes chickenpox. After exposure to VZV, the virus stays in the body in an inactive (dormant) state. Shingles develops if the virus reactivates. This can happen many years after the initial exposure to VZV. It is not known what causes this virus to reactivate. What increases the risk? People who have had chickenpox or received the chickenpox vaccine are at risk for shingles. Infection is more common in people who:  Are older than age 25.  Have a weakened defense (immune) system, such as those with HIV, AIDS, or cancer.  Are taking medicines that weaken the immune system, such as transplant medicines.  Are under great stress. What are the signs or symptoms? Early symptoms of this condition include itching, tingling, and pain in an area on your skin. Pain may be described as burning, stabbing, or throbbing. A few days or weeks after symptoms start, a painful red rash appears, usually on one side of the body in a bandlike or beltlike pattern. The rash eventually turns into fluid-filled blisters that break open, scab over, and dry up in about 2-3 weeks. At any time during the infection, you may also  develop:  A fever.  Chills.  A headache.  An upset stomach. How is this diagnosed? This condition is diagnosed with a skin exam. Sometimes, skin or fluid samples are taken from the blisters before a diagnosis is made. These samples are examined under a microscope or sent to a lab for testing. How is this treated? There is no specific cure for this condition. Your health care provider will probably prescribe medicines to help you manage pain, recover more quickly, and avoid long-term problems. Medicines may include:  Antiviral drugs.  Anti-inflammatory drugs.  Pain medicines. If the area involved is on your face, you may be referred to a specialist, such as an eye doctor (ophthalmologist) or an ear, nose, and throat (ENT) doctor to help you avoid eye problems, chronic pain, or disability. Follow these instructions at home: Medicines   Take medicines only as directed by your health care provider.  Apply an anti-itch or numbing cream to the affected area as directed by your health care provider. Blister and Rash Care   Take a cool bath or apply cool compresses to the area of the rash or blisters as directed by your health care provider. This may help with pain and itching.  Keep your rash covered with a loose bandage (dressing). Wear loose-fitting clothing to help ease the pain of material rubbing against the rash.  Keep your rash and blisters clean with mild soap and cool water or as directed by your health care provider.  Check your rash  every day for signs of infection. These include redness, swelling, and pain that lasts or increases.  Do not pick your blisters.  Do not scratch your rash. General instructions   Rest as directed by your health care provider.  Keep all follow-up visits as directed by your health care provider. This is important.  Until your blisters scab over, your infection can cause chickenpox in people who have never had it or been vaccinated against  it. To prevent this from happening, avoid contact with other people, especially:  Babies.  Pregnant women.  Children who have eczema.  Elderly people who have transplants.  People who have chronic illnesses, such as leukemia or AIDS. Contact a health care provider if:  Your pain is not relieved with prescribed medicines.  Your pain does not get better after the rash heals.  Your rash looks infected. Signs of infection include redness, swelling, and pain that lasts or increases. Get help right away if:  The rash is on your face or nose.  You have facial pain, pain around your eye area, or loss of feeling on one side of your face.  You have ear pain or you have ringing in your ear.  You have loss of taste.  Your condition gets worse. This information is not intended to replace advice given to you by your health care provider. Make sure you discuss any questions you have with your health care provider. Document Released: 06/28/2005 Document Revised: 02/22/2016 Document Reviewed: 05/09/2014 Elsevier Interactive Patient Education  2017 [employer].

## 2016-10-01 NOTE — Progress Notes (Signed)
Subjective:    Patient ID: Sydney Nichols, female    DOB: [birth year], 26 y.o.   MRN: [id number]  HPI Chief Complaint  Patient presents with  . multiple issue    eye swollen and blurry- started hurting yesterday and swollen today, spot on jaw line that hurts with cheek pain   She is new to the practice and here for an acute complaint.  Complains of a 2 day history of right eye tenderness, swelling, itching, and pain. States pain is progressively worsening and she now has pain to the entire right side of her face. Reports a "weird skin sensation" and pain with laughing or facial movements. No change in speech, no tinnitus.  No eye drainage, rhinorrhea, nasal congestion.  No foreign body sensation.  Last eye exam last year and she wears prescription glasses. Dr. Ander PurpuraJohn Ailes is her opthamologist.   Denies ear pain, teeth or gum pain, sore throat, or difficulty swallowing.   Denies fever, chills, rash, headache, dizziness, neck pain.  Denies recent exposure to pets or bites.   Previous medical care: OB/GYN. No PCP in years.   PMH: history of TMJ arthralgia   Married. No children. Works as an Insurance account managerelementary teacher.  LMP: 09/01/15 "usually irregular"  Contraception: nothing   Reviewed allergies, medications, past medical, surgical,  and social history.    Review of Systems Pertinent positives and negatives in the history of present illness.     Objective:   Physical Exam  Constitutional: She is oriented to person, place, and time. Vital signs are normal. She appears well-developed and well-nourished. She does not have a sickly appearance. No distress.  HENT:  Head:    Right Ear: Hearing, tympanic membrane and ear canal normal.  Left Ear: Hearing, tympanic membrane and ear canal normal.  Nose: Right sinus exhibits maxillary sinus tenderness and frontal sinus tenderness. Left sinus exhibits no maxillary sinus tenderness and no frontal sinus tenderness.  Mouth/Throat: Uvula is  midline, oropharynx is clear and moist and mucous membranes are normal.  Profound tenderness to entire right side of her face, heightened sensation. No erythema, rash or asymmetry. Normal functioning but with frown and smiling and most facial movements. Pinpoint pimple to right lower cheek without induration or drainage.    No TMJ or mastoid tenderness.   Eyes: Conjunctivae and EOM are normal. Pupils are equal, round, and reactive to light. Right eye exhibits no discharge. No foreign body present in the right eye. Left eye exhibits no discharge. No foreign body present in the left eye.    Edema to right lower eye lid with tenderness.   Neck: Trachea normal and full passive range of motion without pain. Neck supple. No tracheal tenderness and no muscular tenderness present. No thyromegaly present.  Lymphadenopathy:       Head (right side): No posterior auricular and no occipital adenopathy present.    She has cervical adenopathy.       Right: No supraclavicular adenopathy present.       Left: No supraclavicular adenopathy present.  Mild right anterior cervical adenopathy with tenderness  Neurological: She is alert and oriented to person, place, and time. She has normal strength. No cranial nerve deficit or sensory deficit.  Skin: Skin is warm and dry. No rash noted. No pallor.   BP 110/78   Pulse 64   Temp 98.2 F (36.8 C) (Oral)   Wt 199 lb 6.4 oz (90.4 kg)   BMI 34.23 kg/m  Assessment & Plan:  Acute right eye pain - Plan: CBC with Differential/Platelet, Comprehensive metabolic panel  Right sided facial pain - Plan: CBC with Differential/Platelet, Comprehensive metabolic panel  Late menses - Plan: POCT urine pregnancy  Visual acuity normal.  Discussed possible etiologies including early cellulitis and cannot rule out shingles. Plan to treat her with an antibiotic and send in prescription for Valacyclovir to have in case a rash develops over the weekend. Educated her on  shingles but this is an unlikely etiology. She has a normal neuro exam. No oral lesions or OP involvement.  Urine pregnancy: negative Will get baseline labs CBC, CMP. No recent blood work per patient. Denies diabetes or autoimmune illnesses.  Plan to have her see her eye doctor today at noon.  Asked her to call me on Monday and let me know how she is doing.

## 2016-12-23 ENCOUNTER — Telehealth: Payer: Self-pay | Admitting: Family Medicine

## 2016-12-23 NOTE — Telephone Encounter (Signed)
Pt called states she fell down the steps and hurt her foot and went to [facility] his morning, states was told would be referred to Ortho today & hasn't been able to get anywhere with them.  Called Naima t# [phone] at [facility] and she states X-ray showed no fracture, was reviewed and no need for Ortho, recommend Tramadol, antiinflammatory and RICE & Crutches.  Called pt & advised what they said & to follow up with Vickie if not improving

## 2016-12-24 NOTE — Telephone Encounter (Signed)
Sounds like she needs to schedule a visit. thanks

## 2016-12-27 ENCOUNTER — Encounter: Payer: Self-pay | Admitting: Family Medicine

## 2016-12-27 ENCOUNTER — Ambulatory Visit (INDEPENDENT_AMBULATORY_CARE_PROVIDER_SITE_OTHER): Payer: BC Managed Care – PPO | Admitting: Family Medicine

## 2016-12-27 VITALS — BP 110/60 | HR 80 | Temp 98.6°F

## 2016-12-27 DIAGNOSIS — S99912A Unspecified injury of left ankle, initial encounter: Secondary | ICD-10-CM

## 2016-12-27 NOTE — Patient Instructions (Signed)
Continue doing rest, ice, compression, and elevating it as you have been.  Continue with ibuprofen 800 mg three times daily with food.   See the orthopedist for further evaluation and treatment.

## 2016-12-27 NOTE — Progress Notes (Addendum)
   Subjective:    Patient ID: Sydney Nichols, female    DOB: 1991-06-15, 26 y.o.   MRN: [id number]  HPI Chief Complaint  Patient presents with  . fell down stairs    fell down stairs. [facility] and [facility] and said they saw something but she went back and said it was to swollen to xray. had an appt for thursday at [facility] but cancelled it   She is here with complaints of left ankle pain and swelling. States she injured her ankle by slipping and falling on the stairs outside her home last Thursday. She went to an urgent care later that day on 12/23/2016. States she had an XR and was given pain medication and told to get crutches.  No other injuries or complaints per patient.   States she has not been able to put any weight on her left foot due to pain.   States she has been icing, elevating and resting her ankle. Has done epsom salt soaks and wearing ace wrap as well. Ibuprofen 800 mg 3 times per day. Has also been taking Tramadol twice daily with food.  Denies fever, chills, numbness, tingling, weakness.   Denies previous history of trauma or surgery to her left ankle.   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   Reviewed allergies, medications, past medical, surgical, family, and social history.   Review of Systems Pertinent positives and negatives in the history of present illness.     Objective:   Physical Exam BP 110/60   Pulse 80   Temp 98.6 F (37 C) (Oral)   LLE with normal sensation, cap refill, and pulses. Mild edema noted to left ankle bilaterally.  Profound tenderness to palpation over medial and lateral malleolus, limited ROM due to pain. Left foot nontender. Left calf without tenderness or edema. Left knee normal.       Assessment & Plan:  Left ankle injury, initial encounter  Reviewed report from [facility] on visit on 12/23/2016. XR  results will be scanned into chart. Left foot XR negative. Left ankle XR with soft tissue swelling and no visible fracture.  Exam is limited due to pain with ROM. LLE is neurovascularly intact.  Advised to continue with RICE, crutches and taking Ibuprofen. She will follow up with ortho in 2 days for further evaluation and treatment.

## 2017-06-06 ENCOUNTER — Ambulatory Visit: Payer: BC Managed Care – PPO | Admitting: Family Medicine

## 2017-06-06 ENCOUNTER — Encounter: Payer: Self-pay | Admitting: Family Medicine

## 2017-06-06 VITALS — BP 128/80 | HR 88 | Temp 98.6°F | Wt 204.8 lb

## 2017-06-06 DIAGNOSIS — R10814 Left lower quadrant abdominal tenderness: Secondary | ICD-10-CM | POA: Diagnosis not present

## 2017-06-06 DIAGNOSIS — R1030 Lower abdominal pain, unspecified: Secondary | ICD-10-CM | POA: Diagnosis not present

## 2017-06-06 DIAGNOSIS — N644 Mastodynia: Secondary | ICD-10-CM | POA: Diagnosis not present

## 2017-06-06 DIAGNOSIS — N926 Irregular menstruation, unspecified: Secondary | ICD-10-CM | POA: Diagnosis not present

## 2017-06-06 DIAGNOSIS — L749 Eccrine sweat disorder, unspecified: Secondary | ICD-10-CM | POA: Diagnosis not present

## 2017-06-06 LAB — POCT URINALYSIS DIP (PROADVANTAGE DEVICE)
Bilirubin, UA: NEGATIVE
Blood, UA: NEGATIVE
Glucose, UA: NEGATIVE mg/dL
Ketones, POC UA: NEGATIVE mg/dL
Leukocytes, UA: NEGATIVE
Nitrite, UA: NEGATIVE
Specific Gravity, Urine: 1.03
Urobilinogen, Ur: 3.5
pH, UA: 6 (ref 5.0–8.0)

## 2017-06-06 LAB — POCT URINE PREGNANCY: Preg Test, Ur: NEGATIVE

## 2017-06-06 NOTE — Patient Instructions (Signed)
Keep a close eye on your symptoms and if you notice any new or worsening symptoms call or return.   We will call you with your lab results.

## 2017-06-06 NOTE — Progress Notes (Signed)
Subjective:    Patient ID: Sydney Nichols, female    DOB: February 28, 1991, 26 y.o.   MRN: [id number]  HPI Chief Complaint  Patient presents with  . pain in ovary area.  No period since Sept 23, 2018    Nipple are sore   She is here with complaints of a 2 week history of nipple soreness and sweating. States nipple tenderness resolved.  States she has been feeling like she might be pregnant but had a home pregnancy test that was negative. States lower abdominal pain, bilateral, started yesterday and is intermittently sharp but a constant dull sensation. States pain is made worse with sitting and improves with standing.  Seems to be unrelated to food or bowel movements, urination.  States her last bowel movement was yesterday and normal.  No urinary symptoms.  Denies vaginal discharge, dyspareunia, odor, itching. States she took cold medicine yesterday because she thought she might be getting a cold. Husband is sick.   Denies fever, chills, body aches, headaches, dizziness, chest pain, palpitations, back pain, N/V/D.   LMP: 04/03/2017. Periods are usually irregular.  Reports she is having sex and attempting pregnancy. Is not taking a prenatal vitamin.    Never been pregnant.   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 Pap smear 2-3 years ago.   Reviewed allergies, medications, past medical, surgical, family, and social history.   Review of Systems Pertinent positives and negatives in the history of present illness.     Objective:   Physical Exam  Constitutional: She is oriented to person, place, and time. She appears well-developed and well-nourished. No distress.  HENT:  Mouth/Throat: Uvula is midline, oropharynx is clear and moist and mucous membranes are normal.  Eyes: Conjunctivae and lids are normal. Pupils are equal, round, and reactive to light.  Neck: Full passive range of motion without pain. Neck supple.  Cardiovascular: Normal rate, regular rhythm, normal heart sounds and normal pulses.    Pulmonary/Chest: Effort normal and breath sounds normal.  Abdominal: Soft. Normal appearance and bowel sounds are normal. There is no hepatosplenomegaly. There is tenderness in the left upper quadrant and left lower quadrant. There is no rigidity, no rebound, no guarding, no CVA tenderness, no tenderness at McBurney's point and negative Murphy's sign.  Lymphadenopathy:    She has no cervical adenopathy.  Neurological: She is alert and oriented to person, place, and time. No cranial nerve deficit or sensory deficit. Gait normal.  Skin: Skin is warm and dry. No rash noted. No pallor.  Psychiatric: She has a normal mood and affect. Her speech is normal and behavior is normal.   BP 128/80   Pulse 88   Temp 98.6 F (37 C) (Oral)   Wt 204 lb 12.8 oz (92.9 kg)   SpO2 98%   BMI 35.15 kg/m       Assessment & Plan:  Lower abdominal pain - Plan: POCT Urinalysis DIP (Proadvantage Device), CBC with Differential/Platelet, Comprehensive metabolic panel, hCG, quantitative, pregnancy, POCT urine pregnancy  Irregular menses - Plan: CBC with Differential/Platelet, Comprehensive metabolic panel, TSH, hCG, quantitative, pregnancy, POCT urine pregnancy  Left lower quadrant abdominal tenderness without rebound tenderness  Sweating abnormality - Plan: TSH  Breast tenderness - Plan: hCG, quantitative, pregnancy  UPT negative.  History of irregular periods. Bilateral lower abdominal pain speaks against this being ectopic pregnancy.  Will check hCG quantitative. Discussed that there is no sign of an infectious process at this point.  We will keep a close eye on her symptoms and  do watchful waiting.  She will call with any new or worsening symptoms and I will follow-up with her with lab results.  She may follow-up with her OB/GYN as well.

## 2017-06-07 LAB — HCG, QUANTITATIVE, PREGNANCY: HCG, Total, QN: <2 m[IU]/mL

## 2017-06-07 LAB — COMPREHENSIVE METABOLIC PANEL
AG Ratio: 1.5 (calc) (ref 1.0–2.5)
ALT: 9 U/L (ref 6–29)
AST: 14 U/L (ref 10–30)
Albumin: 4.6 g/dL (ref 3.6–5.1)
Alkaline phosphatase (APISO): 75 U/L (ref 33–115)
BUN: 12 mg/dL (ref 7–25)
CO2: 26 mmol/L (ref 20–32)
Calcium: 9.9 mg/dL (ref 8.6–10.2)
Chloride: 101 mmol/L (ref 98–110)
Creat: 0.78 mg/dL (ref 0.50–1.10)
Globulin: 3 g/dL (calc) (ref 1.9–3.7)
Glucose, Bld: 81 mg/dL (ref 65–99)
Potassium: 4 mmol/L (ref 3.5–5.3)
Sodium: 137 mmol/L (ref 135–146)
Total Bilirubin: 0.4 mg/dL (ref 0.2–1.2)
Total Protein: 7.6 g/dL (ref 6.1–8.1)

## 2017-06-07 LAB — CBC WITH DIFFERENTIAL/PLATELET
Basophils Absolute: 42 cells/uL (ref 0–200)
Basophils Relative: 0.4 %
Eosinophils Absolute: 42 cells/uL (ref 15–500)
Eosinophils Relative: 0.4 %
HCT: 36.4 % (ref 35.0–45.0)
Hemoglobin: 12.5 g/dL (ref 11.7–15.5)
Lymphs Abs: 2163 cells/uL (ref 850–3900)
MCH: 29 pg (ref 27.0–33.0)
MCHC: 34.3 g/dL (ref 32.0–36.0)
MCV: 84.5 fL (ref 80.0–100.0)
MPV: 10.9 fL (ref 7.5–12.5)
Monocytes Relative: 6.6 %
Neutro Abs: 7560 cells/uL (ref 1500–7800)
Neutrophils Relative %: 72 %
Platelets: 342 10*3/uL (ref 140–400)
RBC: 4.31 10*6/uL (ref 3.80–5.10)
RDW: 12.3 % (ref 11.0–15.0)
Total Lymphocyte: 20.6 %
WBC mixed population: 693 cells/uL (ref 200–950)
WBC: 10.5 10*3/uL (ref 3.8–10.8)

## 2017-06-07 LAB — TSH: TSH: 2.52 mIU/L

## 2017-11-17 ENCOUNTER — Ambulatory Visit: Payer: BC Managed Care – PPO | Admitting: Family Medicine

## 2017-11-17 ENCOUNTER — Encounter: Payer: Self-pay | Admitting: Family Medicine

## 2017-11-17 VITALS — BP 112/76 | HR 83 | Resp 16 | Ht 65.5 in | Wt 213.0 lb

## 2017-11-17 DIAGNOSIS — N939 Abnormal uterine and vaginal bleeding, unspecified: Secondary | ICD-10-CM | POA: Diagnosis not present

## 2017-11-17 DIAGNOSIS — R5383 Other fatigue: Secondary | ICD-10-CM

## 2017-11-17 LAB — POCT URINE PREGNANCY: Preg Test, Ur: NEGATIVE

## 2017-11-17 MED ORDER — MEDROXYPROGESTERONE ACETATE 10 MG PO TABS: 10.0000 mg | ORAL_TABLET | Freq: Every day | ORAL | 0 refills | Status: DC

## 2017-11-17 NOTE — Patient Instructions (Signed)
Medroxyprogesterone tablets What is this medicine? MEDROXYPROGESTERONE (me DROX ee proe JES te rone) is a hormone in a class called progestins. It is commonly used to prevent the uterine lining from overgrowth in women taking an estrogen after menopause. It is also used to treat irregular menstrual bleeding or a lack of menstrual bleeding in women. This medicine may be used for other purposes; ask your health care provider or pharmacist if you have questions. COMMON BRAND NAME(S): Amen, Provera What should I tell my health care provider before I take this medicine? They need to know if you have any of these conditions: -blood vessel disease or a history of a blood clot in the lungs or legs -breast, cervical or vaginal cancer -heart disease -kidney disease -liver disease -migraine -recent miscarriage or abortion -mental depression -migraine -seizures (convulsions) -stroke -vaginal bleeding that has not been evaluated -an unusual or allergic reaction to medroxyprogesterone, other medicines, foods, dyes, or preservatives -pregnant or trying to get pregnant -breast-feeding How should I use this medicine? Take this medicine by mouth with a glass of water. Follow the directions on the prescription label. Take your doses at regular intervals. Do not take your medicine more often than directed. Talk to your pediatrician regarding the use of this medicine in children. Special care may be needed. While this drug may be prescribed for children as young as 13 years for selected conditions, precautions do apply. Overdosage: If you think you have taken too much of this medicine contact a poison control center or emergency room at once. NOTE: This medicine is only for you. Do not share this medicine with others. What if I miss a dose? If you miss a dose, take it as soon as you can. If it is almost time for your next dose, take only that dose. Do not take double or extra doses. What may interact with  this medicine? -barbiturate medicines for inducing sleep or treating seizures (convulsions) -bosentan -carbamazepine -phenytoin -rifampin -St. John's Wort This list may not describe all possible interactions. Give your health care provider a list of all the medicines, herbs, non-prescription drugs, or dietary supplements you use. Also tell them if you smoke, drink alcohol, or use illegal drugs. Some items may interact with your medicine. What should I watch for while using this medicine? Visit your health care professional for regular checks on your progress. You will need a regular breast and pelvic exam. If you have any reason to think you are pregnant, stop taking this medicine at once and contact your doctor or health care professional. What side effects may I notice from receiving this medicine? Side effects that you should report to your doctor or health care professional as soon as possible: -breast tenderness or discharge -changes in mood or emotions, such as depression -changes in vision or speech -pain in the abdomen, chest, groin, or leg -severe headache -skin rash, itching, or hives -sudden shortness of breath -unusually weak or tired -yellowing of skin or eyes Side effects that usually do not require medical attention (report to your doctor or health care professional if they continue or are bothersome): -acne -change in menstrual bleeding pattern or flow -changes in sexual desire -facial hair growth -fluid retention and swelling -headache -upset stomach -weight gain or loss This list may not describe all possible side effects. Call your doctor for medical advice about side effects. You may report side effects to FDA at 1-800-FDA-1088. Where should I keep my medicine? Keep out of the reach of children.   Store at room temperature between 20 and 25 degrees C (68 and 77 degrees F). Throw away any unused medicine after the expiration date. NOTE: This sheet is a summary. It  may not cover all possible information. If you have questions about this medicine, talk to your doctor, pharmacist, or health care provider.  2018 Elsevier/Gold Standard (2008-06-27 11:26:12)  

## 2017-11-17 NOTE — Progress Notes (Signed)
   Subjective:    Patient ID: Sydney Nichols, female    DOB: Jul 22, 1990, 27 y.o.   MRN: [id number]  HPI Chief Complaint  Patient presents with  . menstrual    menstrual issues- periods are irregular but never last more than 5-6 days. been going on for 13 days. trying to get pregnant   She is here with complaints of heavy and prolonged menstrual bleeding. Requests medication to stop her period.  Reports having an Implanon in the past but this was removed several years ago.  She and her husband have been attempting to conceive for the past 7 months and she has been using an ovulation kit recently.   States her periods have "always" been irregular. Did not have a cycle in February. March she had normal 7 day cycles and April she reportedly "spotted" for one week and then has had a heavy period since.  States she is wearing super plus tampons and a pad and saturation occurs after only an hour.   Reports severe fatigue as well. Denies fever, chills, dizziness, headache, chest pain, palpitations, shortness of breath, abdominal pain, back pain, N/V/D, urinary symptoms.   She has never been pregnant.   States she has an appt with her OB/GYN next month.   Reviewed allergies, medications, past medical, surgical, family, and social history.   Review of Systems Pertinent positives and negatives in the history of present illness.     Objective:   Physical Exam BP 112/76   Pulse 83   Resp 16   Ht 5' 5.5" (1.664 m)   Wt 213 lb (96.6 kg)   SpO2 98%   BMI 34.91 kg/m   Alert and in no distress.  Pharyngeal area is normal. Neck is supple without adenopathy or thyromegaly. Cardiac exam shows a regular sinus rhythm without murmurs or gallops. Lungs are clear to auscultation. Abdomen is soft, non distended, normal BS, non tender, no rebound or guarding. GU exam not done. MMM, skin is warm and dry, no pallor.        Assessment & Plan:  Abnormal uterine bleeding (AUB) - Plan: CBC with  Differential/Platelet, TSH, Beta hCG quant (ref lab), POCT urine pregnancy, medroxyPROGESTERone (PROVERA) 10 MG tablet  Fatigue, unspecified type - Plan: CBC with Differential/Platelet, Comprehensive metabolic panel, TSH  UPT negative. She is hemodynamically stable.  Provera prescribed and labs ordered including CBC.  She will follow up with her OB/GYN next week.

## 2017-11-18 LAB — CBC WITH DIFFERENTIAL/PLATELET
Basophils Absolute: 0 10*3/uL (ref 0.0–0.2)
Basos: 0 %
EOS (ABSOLUTE): 0.1 10*3/uL (ref 0.0–0.4)
Eos: 1 %
Hematocrit: 37.6 % (ref 34.0–46.6)
Hemoglobin: 12.4 g/dL (ref 11.1–15.9)
Immature Grans (Abs): 0 10*3/uL (ref 0.0–0.1)
Immature Granulocytes: 0 %
Lymphocytes Absolute: 2.1 10*3/uL (ref 0.7–3.1)
Lymphs: 28 %
MCH: 28.4 pg (ref 26.6–33.0)
MCHC: 33 g/dL (ref 31.5–35.7)
MCV: 86 fL (ref 79–97)
Monocytes Absolute: 0.7 10*3/uL (ref 0.1–0.9)
Monocytes: 9 %
Neutrophils Absolute: 4.4 10*3/uL (ref 1.4–7.0)
Neutrophils: 62 %
Platelets: 331 10*3/uL (ref 150–379)
RBC: 4.37 x10E6/uL (ref 3.77–5.28)
RDW: 13.7 % (ref 12.3–15.4)
WBC: 7.3 10*3/uL (ref 3.4–10.8)

## 2017-11-18 LAB — COMPREHENSIVE METABOLIC PANEL
ALT: 13 IU/L (ref 0–32)
AST: 16 IU/L (ref 0–40)
Albumin/Globulin Ratio: 1.6 (ref 1.2–2.2)
Albumin: 4.6 g/dL (ref 3.5–5.5)
Alkaline Phosphatase: 94 IU/L (ref 39–117)
BUN/Creatinine Ratio: 16 (ref 9–23)
BUN: 14 mg/dL (ref 6–20)
Bilirubin Total: <0.2 mg/dL (ref 0.0–1.2)
CO2: 21 mmol/L (ref 20–29)
Calcium: 9.7 mg/dL (ref 8.7–10.2)
Chloride: 104 mmol/L (ref 96–106)
Creatinine, Ser: 0.9 mg/dL (ref 0.57–1.00)
GFR calc Af Amer: 102 mL/min/{1.73_m2} (ref >59)
GFR calc non Af Amer: 89 mL/min/{1.73_m2} (ref >59)
Globulin, Total: 2.9 g/dL (ref 1.5–4.5)
Glucose: 88 mg/dL (ref 65–99)
Potassium: 4.5 mmol/L (ref 3.5–5.2)
Sodium: 140 mmol/L (ref 134–144)
Total Protein: 7.5 g/dL (ref 6.0–8.5)

## 2017-11-18 LAB — BETA HCG QUANT (REF LAB): hCG Quant: <1 m[IU]/mL

## 2017-11-18 LAB — TSH: TSH: 1.26 u[IU]/mL (ref 0.450–4.500)

## 2017-12-30 LAB — HM PAP SMEAR: HM Pap smear: NEGATIVE

## 2018-01-19 ENCOUNTER — Telehealth: Payer: Self-pay

## 2018-01-19 NOTE — Telephone Encounter (Signed)
This is ok

## 2018-01-19 NOTE — Telephone Encounter (Signed)
noted 

## 2018-01-19 NOTE — Telephone Encounter (Signed)
Pt coming in tomorrow to have a tdap due to nephew being born. Pt last had a tdap in 2008. Is this ok?

## 2018-01-20 ENCOUNTER — Other Ambulatory Visit (INDEPENDENT_AMBULATORY_CARE_PROVIDER_SITE_OTHER): Payer: BC Managed Care – PPO

## 2018-01-20 DIAGNOSIS — Z23 Encounter for immunization: Secondary | ICD-10-CM | POA: Diagnosis not present

## 2018-02-09 ENCOUNTER — Telehealth: Payer: Self-pay

## 2018-02-09 NOTE — Telephone Encounter (Signed)
Pt called requesting a list of counselors. Pt was informed that it would be mailed to her

## 2018-09-13 ENCOUNTER — Ambulatory Visit: Payer: BC Managed Care – PPO | Admitting: Medical

## 2018-09-15 DIAGNOSIS — Z319 Encounter for procreative management, unspecified: Secondary | ICD-10-CM | POA: Diagnosis not present

## 2018-09-15 DIAGNOSIS — N978 Female infertility of other origin: Secondary | ICD-10-CM | POA: Diagnosis not present

## 2018-09-15 DIAGNOSIS — E288 Other ovarian dysfunction: Secondary | ICD-10-CM | POA: Diagnosis not present

## 2018-09-18 ENCOUNTER — Encounter: Payer: Self-pay | Admitting: Family Medicine

## 2018-09-22 DIAGNOSIS — E288 Other ovarian dysfunction: Secondary | ICD-10-CM | POA: Diagnosis not present

## 2018-09-22 DIAGNOSIS — N978 Female infertility of other origin: Secondary | ICD-10-CM | POA: Diagnosis not present

## 2018-09-22 DIAGNOSIS — Z319 Encounter for procreative management, unspecified: Secondary | ICD-10-CM | POA: Diagnosis not present

## 2018-11-20 DIAGNOSIS — E282 Polycystic ovarian syndrome: Secondary | ICD-10-CM | POA: Diagnosis not present

## 2018-11-20 DIAGNOSIS — E288 Other ovarian dysfunction: Secondary | ICD-10-CM | POA: Diagnosis not present

## 2018-11-20 DIAGNOSIS — Z113 Encounter for screening for infections with a predominantly sexual mode of transmission: Secondary | ICD-10-CM | POA: Diagnosis not present

## 2018-11-20 DIAGNOSIS — Z319 Encounter for procreative management, unspecified: Secondary | ICD-10-CM | POA: Diagnosis not present

## 2018-11-20 DIAGNOSIS — Z3141 Encounter for fertility testing: Secondary | ICD-10-CM | POA: Diagnosis not present

## 2018-11-20 DIAGNOSIS — Z31430 Encounter of female for testing for genetic disease carrier status for procreative management: Secondary | ICD-10-CM | POA: Diagnosis not present

## 2018-11-20 DIAGNOSIS — N978 Female infertility of other origin: Secondary | ICD-10-CM | POA: Diagnosis not present

## 2018-11-20 DIAGNOSIS — N8500 Endometrial hyperplasia, unspecified: Secondary | ICD-10-CM | POA: Diagnosis not present

## 2018-12-15 DIAGNOSIS — N83292 Other ovarian cyst, left side: Secondary | ICD-10-CM | POA: Diagnosis not present

## 2019-01-15 DIAGNOSIS — N83292 Other ovarian cyst, left side: Secondary | ICD-10-CM | POA: Diagnosis not present

## 2019-01-15 DIAGNOSIS — Z113 Encounter for screening for infections with a predominantly sexual mode of transmission: Secondary | ICD-10-CM | POA: Diagnosis not present

## 2019-01-15 DIAGNOSIS — O200 Threatened abortion: Secondary | ICD-10-CM | POA: Diagnosis not present

## 2019-01-15 DIAGNOSIS — Z3200 Encounter for pregnancy test, result unknown: Secondary | ICD-10-CM | POA: Diagnosis not present

## 2019-01-31 DIAGNOSIS — Z113 Encounter for screening for infections with a predominantly sexual mode of transmission: Secondary | ICD-10-CM | POA: Diagnosis not present

## 2019-01-31 DIAGNOSIS — Z3200 Encounter for pregnancy test, result unknown: Secondary | ICD-10-CM | POA: Diagnosis not present

## 2019-01-31 DIAGNOSIS — N978 Female infertility of other origin: Secondary | ICD-10-CM | POA: Diagnosis not present

## 2019-01-31 DIAGNOSIS — Z3183 Encounter for assisted reproductive fertility procedure cycle: Secondary | ICD-10-CM | POA: Diagnosis not present

## 2019-02-02 DIAGNOSIS — Z3200 Encounter for pregnancy test, result unknown: Secondary | ICD-10-CM | POA: Diagnosis not present

## 2019-02-02 DIAGNOSIS — Z3201 Encounter for pregnancy test, result positive: Secondary | ICD-10-CM | POA: Diagnosis not present

## 2019-02-14 DIAGNOSIS — Z3200 Encounter for pregnancy test, result unknown: Secondary | ICD-10-CM | POA: Diagnosis not present

## 2019-02-21 DIAGNOSIS — Z3200 Encounter for pregnancy test, result unknown: Secondary | ICD-10-CM | POA: Diagnosis not present

## 2019-02-26 DIAGNOSIS — O200 Threatened abortion: Secondary | ICD-10-CM | POA: Diagnosis not present

## 2019-03-05 DIAGNOSIS — O0900 Supervision of pregnancy with history of infertility, unspecified trimester: Secondary | ICD-10-CM | POA: Diagnosis not present

## 2019-03-14 DIAGNOSIS — Z3689 Encounter for other specified antenatal screening: Secondary | ICD-10-CM | POA: Diagnosis not present

## 2019-03-14 DIAGNOSIS — Z3A09 9 weeks gestation of pregnancy: Secondary | ICD-10-CM | POA: Diagnosis not present

## 2019-03-14 DIAGNOSIS — O09811 Supervision of pregnancy resulting from assisted reproductive technology, first trimester: Secondary | ICD-10-CM | POA: Diagnosis not present

## 2019-03-14 DIAGNOSIS — Z113 Encounter for screening for infections with a predominantly sexual mode of transmission: Secondary | ICD-10-CM | POA: Diagnosis not present

## 2019-03-14 LAB — OB RESULTS CONSOLE ABO/RH: RH Type: POSITIVE

## 2019-03-14 LAB — OB RESULTS CONSOLE GC/CHLAMYDIA
Chlamydia: NEGATIVE
Gonorrhea: NEGATIVE

## 2019-03-14 LAB — OB RESULTS CONSOLE HEPATITIS B SURFACE ANTIGEN: Hepatitis B Surface Ag: NEGATIVE

## 2019-03-14 LAB — OB RESULTS CONSOLE RUBELLA ANTIBODY, IGM: Rubella: IMMUNE

## 2019-03-14 LAB — OB RESULTS CONSOLE HIV ANTIBODY (ROUTINE TESTING): HIV: NONREACTIVE

## 2019-03-14 LAB — OB RESULTS CONSOLE RPR: RPR: NONREACTIVE

## 2019-04-12 DIAGNOSIS — Z23 Encounter for immunization: Secondary | ICD-10-CM | POA: Diagnosis not present

## 2019-05-16 DIAGNOSIS — Z363 Encounter for antenatal screening for malformations: Secondary | ICD-10-CM | POA: Diagnosis not present

## 2019-05-16 DIAGNOSIS — O09812 Supervision of pregnancy resulting from assisted reproductive technology, second trimester: Secondary | ICD-10-CM | POA: Diagnosis not present

## 2019-05-16 DIAGNOSIS — Z3A18 18 weeks gestation of pregnancy: Secondary | ICD-10-CM | POA: Diagnosis not present

## 2019-06-11 DIAGNOSIS — Z362 Encounter for other antenatal screening follow-up: Secondary | ICD-10-CM | POA: Diagnosis not present

## 2019-06-11 DIAGNOSIS — Z3A22 22 weeks gestation of pregnancy: Secondary | ICD-10-CM | POA: Diagnosis not present

## 2019-07-23 DIAGNOSIS — Z23 Encounter for immunization: Secondary | ICD-10-CM | POA: Diagnosis not present

## 2019-07-23 DIAGNOSIS — Z3689 Encounter for other specified antenatal screening: Secondary | ICD-10-CM | POA: Diagnosis not present

## 2019-07-30 DIAGNOSIS — O2603 Excessive weight gain in pregnancy, third trimester: Secondary | ICD-10-CM | POA: Diagnosis not present

## 2019-08-06 DIAGNOSIS — R82998 Other abnormal findings in urine: Secondary | ICD-10-CM | POA: Diagnosis not present

## 2019-08-13 DIAGNOSIS — Z03818 Encounter for observation for suspected exposure to other biological agents ruled out: Secondary | ICD-10-CM | POA: Diagnosis not present

## 2019-08-13 DIAGNOSIS — Z20828 Contact with and (suspected) exposure to other viral communicable diseases: Secondary | ICD-10-CM | POA: Diagnosis not present

## 2019-08-21 DIAGNOSIS — D649 Anemia, unspecified: Secondary | ICD-10-CM | POA: Diagnosis not present

## 2019-08-30 ENCOUNTER — Telehealth: Payer: Self-pay | Admitting: Hematology and Oncology

## 2019-08-30 NOTE — Telephone Encounter (Signed)
Received a new hem referral from [facility] for anemia in pregnacy. MS. Sydney Nichols has been cld and scheduled to see Dr. Leonides Schanz on 3/1 at 1pm. Pt aware to arrive 15 minutes early.

## 2019-09-05 DIAGNOSIS — Z3A34 34 weeks gestation of pregnancy: Secondary | ICD-10-CM | POA: Diagnosis not present

## 2019-09-05 DIAGNOSIS — O3663X0 Maternal care for excessive fetal growth, third trimester, not applicable or unspecified: Secondary | ICD-10-CM | POA: Diagnosis not present

## 2019-09-05 DIAGNOSIS — O09813 Supervision of pregnancy resulting from assisted reproductive technology, third trimester: Secondary | ICD-10-CM | POA: Diagnosis not present

## 2019-09-10 ENCOUNTER — Other Ambulatory Visit: Payer: Self-pay

## 2019-09-10 ENCOUNTER — Inpatient Hospital Stay: Payer: BC Managed Care – PPO

## 2019-09-10 ENCOUNTER — Encounter: Payer: Self-pay | Admitting: Hematology and Oncology

## 2019-09-10 ENCOUNTER — Inpatient Hospital Stay: Payer: BC Managed Care – PPO | Attending: Hematology and Oncology | Admitting: Hematology and Oncology

## 2019-09-10 VITALS — BP 110/70 | HR 112 | Temp 98.3°F | Resp 18 | Ht 65.5 in | Wt 207.5 lb

## 2019-09-10 DIAGNOSIS — R110 Nausea: Secondary | ICD-10-CM | POA: Insufficient documentation

## 2019-09-10 DIAGNOSIS — G4762 Sleep related leg cramps: Secondary | ICD-10-CM | POA: Insufficient documentation

## 2019-09-10 DIAGNOSIS — D500 Iron deficiency anemia secondary to blood loss (chronic): Secondary | ICD-10-CM

## 2019-09-10 DIAGNOSIS — N809 Endometriosis, unspecified: Secondary | ICD-10-CM | POA: Diagnosis not present

## 2019-09-10 DIAGNOSIS — O99013 Anemia complicating pregnancy, third trimester: Secondary | ICD-10-CM | POA: Insufficient documentation

## 2019-09-10 DIAGNOSIS — Z9289 Personal history of other medical treatment: Secondary | ICD-10-CM

## 2019-09-10 DIAGNOSIS — E282 Polycystic ovarian syndrome: Secondary | ICD-10-CM | POA: Insufficient documentation

## 2019-09-10 DIAGNOSIS — Z8759 Personal history of other complications of pregnancy, childbirth and the puerperium: Secondary | ICD-10-CM

## 2019-09-10 DIAGNOSIS — Z3A36 36 weeks gestation of pregnancy: Secondary | ICD-10-CM | POA: Diagnosis not present

## 2019-09-10 DIAGNOSIS — N921 Excessive and frequent menstruation with irregular cycle: Secondary | ICD-10-CM | POA: Insufficient documentation

## 2019-09-10 DIAGNOSIS — D509 Iron deficiency anemia, unspecified: Secondary | ICD-10-CM | POA: Insufficient documentation

## 2019-09-10 DIAGNOSIS — R5383 Other fatigue: Secondary | ICD-10-CM | POA: Diagnosis not present

## 2019-09-10 HISTORY — DX: Personal history of other complications of pregnancy, childbirth and the puerperium: Z87.59

## 2019-09-10 HISTORY — DX: Personal history of other medical treatment: Z92.89

## 2019-09-10 LAB — CMP (CANCER CENTER ONLY)
ALT: 16 U/L (ref 0–44)
AST: 26 U/L (ref 15–41)
Albumin: 2.9 g/dL — ABNORMAL LOW (ref 3.5–5.0)
Alkaline Phosphatase: 200 U/L — ABNORMAL HIGH (ref 38–126)
Anion gap: 8 (ref 5–15)
BUN: 7 mg/dL (ref 6–20)
CO2: 26 mmol/L (ref 22–32)
Calcium: 9.5 mg/dL (ref 8.9–10.3)
Chloride: 104 mmol/L (ref 98–111)
Creatinine: 0.79 mg/dL (ref 0.44–1.00)
GFR, Est AFR Am: >60 mL/min (ref >60)
GFR, Estimated: >60 mL/min (ref >60)
Glucose, Bld: 110 mg/dL — ABNORMAL HIGH (ref 70–99)
Potassium: 3.5 mmol/L (ref 3.5–5.1)
Sodium: 138 mmol/L (ref 135–145)
Total Bilirubin: 0.2 mg/dL — ABNORMAL LOW (ref 0.3–1.2)
Total Protein: 7.2 g/dL (ref 6.5–8.1)

## 2019-09-10 LAB — CBC WITH DIFFERENTIAL (CANCER CENTER ONLY)
Abs Immature Granulocytes: 0.22 10*3/uL — ABNORMAL HIGH (ref 0.00–0.07)
Basophils Absolute: 0 10*3/uL (ref 0.0–0.1)
Basophils Relative: 0 %
Eosinophils Absolute: 0.1 10*3/uL (ref 0.0–0.5)
Eosinophils Relative: 1 %
HCT: 27.4 % — ABNORMAL LOW (ref 36.0–46.0)
Hemoglobin: 8.5 g/dL — ABNORMAL LOW (ref 12.0–15.0)
Immature Granulocytes: 2 %
Lymphocytes Relative: 16 %
Lymphs Abs: 2.1 10*3/uL (ref 0.7–4.0)
MCH: 22.7 pg — ABNORMAL LOW (ref 26.0–34.0)
MCHC: 31 g/dL (ref 30.0–36.0)
MCV: 73.3 fL — ABNORMAL LOW (ref 80.0–100.0)
Monocytes Absolute: 1.2 10*3/uL — ABNORMAL HIGH (ref 0.1–1.0)
Monocytes Relative: 10 %
Neutro Abs: 9 10*3/uL — ABNORMAL HIGH (ref 1.7–7.7)
Neutrophils Relative %: 71 %
Platelet Count: 347 10*3/uL (ref 150–400)
RBC: 3.74 MIL/uL — ABNORMAL LOW (ref 3.87–5.11)
RDW: 16.1 % — ABNORMAL HIGH (ref 11.5–15.5)
WBC Count: 12.7 10*3/uL — ABNORMAL HIGH (ref 4.0–10.5)
nRBC: 0 % (ref 0.0–0.2)

## 2019-09-10 LAB — SAVE SMEAR(SSMR), FOR PROVIDER SLIDE REVIEW

## 2019-09-10 LAB — RETIC PANEL
Immature Retic Fract: 21.4 % — ABNORMAL HIGH (ref 2.3–15.9)
RBC.: 3.67 MIL/uL — ABNORMAL LOW (ref 3.87–5.11)
Retic Count, Absolute: 38.2 10*3/uL (ref 19.0–186.0)
Retic Ct Pct: 1 % (ref 0.4–3.1)
Reticulocyte Hemoglobin: 24.2 pg — ABNORMAL LOW (ref >27.9)

## 2019-09-10 LAB — VITAMIN B12: Vitamin B-12: 145 pg/mL — ABNORMAL LOW (ref 180–914)

## 2019-09-10 LAB — IRON AND TIBC
Iron: 19 ug/dL — ABNORMAL LOW (ref 41–142)
Saturation Ratios: 3 % — ABNORMAL LOW (ref 21–57)
TIBC: 691 ug/dL — ABNORMAL HIGH (ref 236–444)
UIBC: 673 ug/dL — ABNORMAL HIGH (ref 120–384)

## 2019-09-10 LAB — FOLATE: Folate: 30.6 ng/mL (ref >5.9)

## 2019-09-10 LAB — FERRITIN: Ferritin: <4 ng/mL — ABNORMAL LOW (ref 11–307)

## 2019-09-10 LAB — LACTATE DEHYDROGENASE: LDH: 151 U/L (ref 98–192)

## 2019-09-10 NOTE — Progress Notes (Signed)
[facility] [phone]   [phone]  INITIAL CONSULT NOTE  Patient Care Team: Avanell Shackleton, NP-C as PCP - General (Family Medicine)  Hematological/Oncological History #Microcytic Anemia in Pregnancy  1) 11/17/2017: WBC 7.3, Hgb 12.4, MCV 86, Plt 331 2) 03/14/2019: Hgb 11.2, MCV 79, Plt 400 3) 07/23/19: Hgb 8.8, MCV 76, Plt 315 4) 07/24/19: started ferrous sulfate 325mg  daily  5) 09/10/2019: establish care with Dr. 11/10/2019   CHIEF COMPLAINTS/PURPOSE OF CONSULTATION:  "Microcytic Anemia in Pregnancy  "  HISTORY OF PRESENTING ILLNESS:  Sydney Nichols 29 y.o. female with medical history significant for PCOS and endometriosis who presents for evaluation of anemia in pregnancy.   On review of the previous records Sydney Nichols has records of normal Hgb from 2013-2019 in our system. She had initial labs drawn on 03/14/2019 during this pregnancy which showed Hgb 11.2, MCV 79, and Plt 331.  Most recently on 07/24/2019 the patient was found to have a hemoglobin 8.8, MCV of 76, and platelet count of 315.  Due to concern for the anemia during pregnancy the patient was referred to hematology for further evaluation and management.  On exam today Sydney Nichols notes that she is doing well.  She notes that she is having fatigue with this pregnancy and also having issues with nausea.  She is taking p.o. Zofran to help with the nausea.  She notes that she is having cramps in her legs, but is otherwise asymptomatic.  She does note that she craves a lot of cold water.  Also endorses drinking large amounts of chocolate milk during her pregnancy.  She is not on any medications other than p.o. Zofran and prenatal vitamins.  On further discussion she notes that prior to her pregnancy she had markedly heavy periods due to PCOS and endometriosis.  She reports that her longest cycle lasted approximately 73 days.  These were very irregular but she notes that she would soak through about 5-6  pads per day during the cycle.  She notes that she used heavy pads and would not saturate all the way through.  She denies any other sources of bleeding including nosebleeds, bruising, or dark stools prior to taking her iron therapy.  In regards to iron therapy she notes that she is tolerating it well without any stomach issues or constipation.  They have also prescribed for her stool softener which she is taking with her iron therapy.  On further discussion she has no remarkable family history for blood disorders.  She is a non-smoker nondrinker and is otherwise quite healthy at baseline.  She reports that her plan delivery date is 10/11/2019 and that she is currently [redacted] weeks pregnant.  The delivery is planned to be a natural delivery.  A full 10 point ROS is listed below.  MEDICAL HISTORY:  Past Medical History:  Diagnosis Date  . Acne   . Allergy    RHINITIS  . Eczema   . Endometriosis   . PCOS (polycystic ovarian syndrome)     SURGICAL HISTORY: History reviewed. No pertinent surgical history.  SOCIAL HISTORY: Social History   Socioeconomic History  . Marital status: Married    Spouse name: Not on file  . Number of children: Not on file  . Years of education: Not on file  . Highest education level: Not on file  Occupational History  . Not on file  Tobacco Use  . Smoking status: Never Smoker  . Smokeless tobacco: Never Used  Substance  and Sexual Activity  . Alcohol use: No  . Drug use: No  . Sexual activity: Yes    Partners: Male    Birth control/protection: None  Other Topics Concern  . Not on file  Social History Narrative  . Not on file   Social Determinants of Health   Financial Resource Strain:   . Difficulty of Paying Living Expenses: Not on file  Food Insecurity:   . Worried About Charity fundraiser in the Last Year: Not on file  . Ran Out of Food in the Last Year: Not on file  Transportation Needs:   . Lack of Transportation (Medical): Not on file  . Lack  of Transportation (Non-Medical): Not on file  Physical Activity:   . Days of Exercise per Week: Not on file  . Minutes of Exercise per Session: Not on file  Stress:   . Feeling of Stress : Not on file  Social Connections:   . Frequency of Communication with Friends and Family: Not on file  . Frequency of Social Gatherings with Friends and Family: Not on file  . Attends Religious Services: Not on file  . Active Member of Clubs or Organizations: Not on file  . Attends Archivist Meetings: Not on file  . Marital Status: Not on file  Intimate Partner Violence:   . Fear of Current or Ex-Partner: Not on file  . Emotionally Abused: Not on file  . Physically Abused: Not on file  . Sexually Abused: Not on file    FAMILY HISTORY: Family History  Problem Relation Age of Onset  . Thyroid disease Father   . Brain cancer Paternal Aunt   . Diabetes Maternal Grandfather     ALLERGIES:  has No Known Allergies.  MEDICATIONS:  Current Outpatient Medications  Medication Sig Dispense Refill  . ferrous sulfate 325 (65 FE) MG tablet Take 325 mg by mouth 2 (two) times daily with a meal.    . ondansetron (ZOFRAN-ODT) 4 MG disintegrating tablet Take 4 mg by mouth every 8 (eight) hours as needed for nausea or vomiting.     No current facility-administered medications for this visit.    REVIEW OF SYSTEMS:   Constitutional: ( - ) fevers, ( - )  chills , ( - ) night sweats Eyes: ( - ) blurriness of vision, ( - ) double vision, ( - ) watery eyes Ears, nose, mouth, throat, and face: ( - ) mucositis, ( - ) sore throat Respiratory: ( - ) cough, ( - ) dyspnea, ( - ) wheezes Cardiovascular: ( - ) palpitation, ( - ) chest discomfort, ( - ) lower extremity swelling Gastrointestinal:  ( + ) nausea, ( + ) heartburn, ( - ) change in bowel habits Skin: ( - ) abnormal skin rashes Lymphatics: ( - ) new lymphadenopathy, ( - ) easy bruising Neurological: ( - ) numbness, ( - ) tingling, ( - ) new  weaknesses Behavioral/Psych: ( - ) mood change, ( - ) new changes  All other systems were reviewed with the patient and are negative.  PHYSICAL EXAMINATION: ECOG PERFORMANCE STATUS: 1 - Symptomatic but completely ambulatory  Vitals:   09/10/19 1302  BP: 110/70  Pulse: (!) 112  Resp: 18  Temp: 98.3 F (36.8 C)  SpO2: 99%   Filed Weights   09/10/19 1302  Weight: 207 lb 8 oz (94.1 kg)    GENERAL: well appearing young Caucasian female in NAD  SKIN: skin color, texture, turgor are normal,  no rashes or significant lesions EYES: conjunctiva are pale and non-injected, sclera clear LUNGS: clear to auscultation and percussion with normal breathing effort HEART: regular rate & rhythm and no murmurs and no lower extremity edema ABDOMEN: gravid uterus, cannot appreciate HSM.  Musculoskeletal: no cyanosis of digits and no clubbing  PSYCH: alert & oriented x 3, fluent speech NEURO: no focal motor/sensory deficits  LABORATORY DATA:  I have reviewed the data as listed CBC Latest Ref Rng & Units 09/10/2019 11/17/2017 06/06/2017  WBC 4.0 - 10.5 K/uL 12.7(H) 7.3 10.5  Hemoglobin 12.0 - 15.0 g/dL 3.8(S) 50.5 39.7  Hematocrit 36.0 - 46.0 % 27.4(L) 37.6 36.4  Platelets 150 - 400 K/uL 347 331 342    CMP Latest Ref Rng & Units 11/17/2017 06/06/2017 10/01/2016  Glucose 65 - 99 mg/dL 88 81 87  BUN 6 - 20 mg/dL 14 12 11   Creatinine 0.57 - 1.00 mg/dL 6.73 4.19 3.79  Sodium 134 - 144 mmol/L 140 137 137  Potassium 3.5 - 5.2 mmol/L 4.5 4.0 4.6  Chloride 96 - 106 mmol/L 104 101 103  CO2 20 - 29 mmol/L 21 26 28   Calcium 8.7 - 10.2 mg/dL 9.7 9.9 9.6  Total Protein 6.0 - 8.5 g/dL 7.5 7.6 7.3  Total Bilirubin 0.0 - 1.2 mg/dL <0.2 0.4 0.4  Alkaline Phos 39 - 117 IU/L 94 - 81  AST 0 - 40 IU/L 16 14 18   ALT 0 - 32 IU/L 13 9 13      PATHOLOGY: None relevant to review.   BLOOD FILM:  Review of the peripheral blood smear showed normal appearing white cells with neutrophils that were appropriately lobated  and granulated. There was no predominance of bi-lobed or hyper-segmented neutrophils appreciated. No Dohle bodies were noted. There was no left shifting, immature forms or blasts noted. Lymphocytes remain normal in size without any predominance of large granular lymphocytes. Red cells show hypochromia, but no schistocytes, target cells, echinocytes, acanthocytes, dacrocytes, or stomatocytes.There was no rouleaux formation, nucleated red cells, or intra-cellular inclusions noted. The platelets are normal in size, shape, and color without any clumping evident.  RADIOGRAPHIC STUDIES: None relevant to review.  No results found.  ASSESSMENT & PLAN [name] 29 y.o. female with medical history significant for PCOS and endometriosis who presents for evaluation of anemia in pregnancy.  After review the labs and discussion with the patient her findings are most consistent with an iron deficiency anemia secondary to chronic blood loss from PCOS and endometriosis.  Patient reports that she did have heavy irregular periods prior to becoming pregnant with in vitro fertilization.  It is possible that the patient has been consistently iron deficient though pregnancy has helped to unmask this condition.  Additionally is worth noting that the patient has stopped eating red meat while pregnant and that the decreased intake of this iron source could potentially be contributing to her anemia.  Today we will check her CBC, blood film, and iron levels to confirm the diagnosis.  Additionally we will sure no other nutritional deficiencies with a vitamin B12 level, folate level, and essential metabolites.  I would recommend that patient continue to take her p.o. iron 325 mg, but instead take it daily with a source of vitamin C.  Unfortunately with the patient's delivery date so close we are not recommending IV iron.  Unfortunately we would expect hemoglobin to increase approximately 1 g every 2 weeks.  If we were to  administer IV iron now before the date of  delivery there would only be an anticipated increase of approximately 1 g.  Given the minimal benefit in the risks prior to delivery I would recommend that we continue with p.o. therapy alone and reassess her following the pregnancy determine if IV iron is required.  If there are issues regarding anemia around the time of delivery we strongly recommend consultation to our service for assistance.  #Microcytic Anemia in Pregnancy  --findings are most concerning for an iron deficiency anemia unmasked in pregnancy --today we will repeat CBC and will order CMP, retic panel, and iron panel --additionally will collect nutritional studies with Vitamin B12, folate, MMA, and homocysteine --continue PO iron 325mg  daily with a source of vitamin C. Do not take iron with milk or calcium --with the patient's delivery date so close (10/11/2019) there is little utility in giving IV iron as it would not improve her counts a significant amount prior to delivery --recommend patient continue her PO iron and return to our clinic 4 weeks after delivery to reassess and determine if IV iron is required.  --RTC in approximately 4 weeks after date of delivery    Orders Placed This Encounter  Procedures  . CBC with Differential (Cancer Center Only)    Standing Status:   Future    Number of Occurrences:   1    Standing Expiration Date:   09/09/2020  . Retic Panel    Standing Status:   Future    Number of Occurrences:   1    Standing Expiration Date:   09/09/2020  . Save Smear (SS[facility])    Standing Status:   Future    Number of Occurrences:   1    Standing Expiration Date:   09/09/2020  . CMP (Cancer Center only)    Standing Status:   Future    Number of Occurrences:   1    Standing Expiration Date:   09/09/2020  . Lactate dehydrogenase (LDH)    Standing Status:   Future    Number of Occurrences:   1    Standing Expiration Date:   09/09/2020  . Iron and TIBC    Standing Status:    Future    Number of Occurrences:   1    Standing Expiration Date:   09/09/2020  . Ferritin    Standing Status:   Future    Number of Occurrences:   1    Standing Expiration Date:   09/09/2020  . Folate, Serum    Standing Status:   Future    Number of Occurrences:   1    Standing Expiration Date:   09/09/2020  . Vitamin B12    Standing Status:   Future    Number of Occurrences:   1    Standing Expiration Date:   09/09/2020  . Methylmalonic acid, serum    Standing Status:   Future    Number of Occurrences:   1    Standing Expiration Date:   09/09/2020  . Homocysteine, serum    Standing Status:   Future    Number of Occurrences:   1    Standing Expiration Date:   09/09/2020    All questions were answered. The patient knows to call the clinic with any problems, questions or concerns.  A total of more than 45 minutes were spent on this encounter and over half of that time was spent on counseling and coordination of care as outlined above.   11/09/2020, MD Department of Hematology/Oncology  [facility] Phone: [phone] Pager: [phone] Email: [email or web address]  09/10/2019 2:03 PM

## 2019-09-11 LAB — HOMOCYSTEINE: Homocysteine: 9 umol/L (ref 0.0–14.5)

## 2019-09-14 LAB — METHYLMALONIC ACID, SERUM: Methylmalonic Acid, Quantitative: 236 nmol/L (ref 0–378)

## 2019-09-19 ENCOUNTER — Inpatient Hospital Stay (HOSPITAL_BASED_OUTPATIENT_CLINIC_OR_DEPARTMENT_OTHER): Payer: BC Managed Care – PPO

## 2019-09-19 ENCOUNTER — Inpatient Hospital Stay (HOSPITAL_COMMUNITY)
Admission: AD | Admit: 2019-09-19 | Discharge: 2019-09-19 | Disposition: A | Discharge status: Home / Self Care | Payer: BC Managed Care – PPO | Attending: Obstetrics and Gynecology | Admitting: Obstetrics and Gynecology

## 2019-09-19 ENCOUNTER — Inpatient Hospital Stay (EMERGENCY_DEPARTMENT_HOSPITAL)
Admission: AD | Admit: 2019-09-19 | Discharge: 2019-09-19 | Disposition: A | Discharge status: Home / Self Care | Payer: BC Managed Care – PPO | Source: Home / Self Care | Attending: Obstetrics and Gynecology | Admitting: Obstetrics and Gynecology

## 2019-09-19 ENCOUNTER — Other Ambulatory Visit: Payer: Self-pay

## 2019-09-19 ENCOUNTER — Encounter (HOSPITAL_COMMUNITY): Payer: Self-pay | Admitting: Obstetrics and Gynecology

## 2019-09-19 DIAGNOSIS — O99343 Other mental disorders complicating pregnancy, third trimester: Secondary | ICD-10-CM | POA: Diagnosis not present

## 2019-09-19 DIAGNOSIS — F419 Anxiety disorder, unspecified: Secondary | ICD-10-CM

## 2019-09-19 DIAGNOSIS — O368131 Decreased fetal movements, third trimester, fetus 1: Secondary | ICD-10-CM | POA: Diagnosis not present

## 2019-09-19 DIAGNOSIS — Z3689 Encounter for other specified antenatal screening: Secondary | ICD-10-CM

## 2019-09-19 DIAGNOSIS — O368130 Decreased fetal movements, third trimester, not applicable or unspecified: Secondary | ICD-10-CM | POA: Insufficient documentation

## 2019-09-19 DIAGNOSIS — D649 Anemia, unspecified: Secondary | ICD-10-CM

## 2019-09-19 DIAGNOSIS — Z3A36 36 weeks gestation of pregnancy: Secondary | ICD-10-CM

## 2019-09-19 DIAGNOSIS — O133 Gestational [pregnancy-induced] hypertension without significant proteinuria, third trimester: Secondary | ICD-10-CM

## 2019-09-19 DIAGNOSIS — O09813 Supervision of pregnancy resulting from assisted reproductive technology, third trimester: Secondary | ICD-10-CM | POA: Diagnosis not present

## 2019-09-19 DIAGNOSIS — O99013 Anemia complicating pregnancy, third trimester: Secondary | ICD-10-CM

## 2019-09-19 LAB — COMPREHENSIVE METABOLIC PANEL
ALT: 16 U/L (ref 0–44)
AST: 21 U/L (ref 15–41)
Albumin: 3 g/dL — ABNORMAL LOW (ref 3.5–5.0)
Alkaline Phosphatase: 178 U/L — ABNORMAL HIGH (ref 38–126)
Anion gap: 9 (ref 5–15)
BUN: 5 mg/dL — ABNORMAL LOW (ref 6–20)
CO2: 24 mmol/L (ref 22–32)
Calcium: 10.2 mg/dL (ref 8.9–10.3)
Chloride: 102 mmol/L (ref 98–111)
Creatinine, Ser: 0.96 mg/dL (ref 0.44–1.00)
GFR calc Af Amer: >60 mL/min (ref >60)
GFR calc non Af Amer: >60 mL/min (ref >60)
Glucose, Bld: 78 mg/dL (ref 70–99)
Potassium: 3.9 mmol/L (ref 3.5–5.1)
Sodium: 135 mmol/L (ref 135–145)
Total Bilirubin: 0.3 mg/dL (ref 0.3–1.2)
Total Protein: 6.9 g/dL (ref 6.5–8.1)

## 2019-09-19 LAB — URINALYSIS, ROUTINE W REFLEX MICROSCOPIC
Bilirubin Urine: NEGATIVE
Glucose, UA: NEGATIVE mg/dL
Hgb urine dipstick: NEGATIVE
Ketones, ur: NEGATIVE mg/dL
Leukocytes,Ua: NEGATIVE
Nitrite: NEGATIVE
Protein, ur: NEGATIVE mg/dL
Specific Gravity, Urine: 1.002 — ABNORMAL LOW (ref 1.005–1.030)
pH: 7 (ref 5.0–8.0)

## 2019-09-19 LAB — PROTEIN / CREATININE RATIO, URINE
Creatinine, Urine: 29.98 mg/dL
Total Protein, Urine: <6 mg/dL

## 2019-09-19 LAB — CBC
HCT: 27.5 % — ABNORMAL LOW (ref 36.0–46.0)
Hemoglobin: 8.7 g/dL — ABNORMAL LOW (ref 12.0–15.0)
MCH: 22.5 pg — ABNORMAL LOW (ref 26.0–34.0)
MCHC: 31.6 g/dL (ref 30.0–36.0)
MCV: 71.2 fL — ABNORMAL LOW (ref 80.0–100.0)
Platelets: 351 10*3/uL (ref 150–400)
RBC: 3.86 MIL/uL — ABNORMAL LOW (ref 3.87–5.11)
RDW: 16.2 % — ABNORMAL HIGH (ref 11.5–15.5)
WBC: 16.4 10*3/uL — ABNORMAL HIGH (ref 4.0–10.5)
nRBC: 0 % (ref 0.0–0.2)

## 2019-09-19 MED ORDER — ACETAMINOPHEN 500 MG PO TABS
1000.0000 mg | ORAL_TABLET | Freq: Once | ORAL | Status: AC
  Administered 2019-09-19: 1000 mg via ORAL
  Filled 2019-09-19: qty 2

## 2019-09-19 NOTE — MAU Note (Signed)
Pt reports +FM.

## 2019-09-19 NOTE — Discharge Instructions (Signed)

## 2019-09-19 NOTE — MAU Provider Note (Signed)
Chief Complaint:  Decreased Fetal Movement   First Provider Initiated Contact with Patient 09/19/19 2226     HPI: Sydney Nichols is a 29 y.o. G1P0 at 37w6dwho presents to maternity admissions reporting decreased perception of fetal movement all day. .Was seen here today for the same thing.  She called her doctor with concerns and was told she could come back in for a BPP.  Dr Wilhelmenia Blase notified us of this.  She reports good fetal movement, denies LOF, vaginal bleeding, vaginal itching/burning, urinary symptoms, h/a, dizziness, n/v, diarrhea, constipation or fever/chills.  She denies headache, visual changes or RUQ abdominal pain.  Other This is a recurrent problem. The current episode started today. The problem occurs constantly. The problem has been unchanged. Pertinent negatives include no abdominal pain, chest pain, chills, congestion, fatigue, fever, headaches, nausea or vomiting. Nothing aggravates the symptoms. She has tried nothing for the symptoms.    RN Note: Pt states she was here earlier and was sent home around 8pm. States she has a lot of anxiety, but is worried about baby as he is not moving like usual. Has felt baby move since she was discharge home, but still has concerns. States she called doctor and was told to "come in for ultrasound".  Past Medical History: Past Medical History:  Diagnosis Date  . Acne   . Allergy    RHINITIS  . Eczema   . Endometriosis   . PCOS (polycystic ovarian syndrome)     Past obstetric history: OB History  Gravida Para Term Preterm AB Living  1            SAB TAB Ectopic Multiple Live Births               # Outcome Date GA Lbr Len/2nd Weight Sex Delivery Anes PTL Lv  1 Current             Past Surgical History: No past surgical history on file.  Family History: Family History  Problem Relation Age of Onset  . Thyroid disease Father   . Brain cancer Paternal Aunt   . Diabetes Maternal Grandfather     Social History: Social  History   Tobacco Use  . Smoking status: Never Smoker  . Smokeless tobacco: Never Used  Substance Use Topics  . Alcohol use: No  . Drug use: No    Allergies: No Known Allergies  Meds:  Medications Prior to Admission  Medication Sig Dispense Refill Last Dose  . docusate sodium (COLACE) 100 MG capsule Take 100 mg by mouth as needed for mild constipation.     . ferrous sulfate 325 (65 FE) MG tablet Take 325 mg by mouth 2 (two) times daily with a meal.     . ondansetron (ZOFRAN-ODT) 4 MG disintegrating tablet Take 4 mg by mouth every 8 (eight) hours as needed for nausea or vomiting.     . Prenatal Vit-Fe Fumarate-FA (MULTIVITAMIN-PRENATAL) 27-0.8 MG TABS tablet Take 1 tablet by mouth daily at 12 noon.       I have reviewed patient's Past Medical Hx, Surgical Hx, Family Hx, Social Hx, medications and allergies.   ROS:  Review of Systems  Constitutional: Negative for chills, fatigue and fever.  HENT: Negative for congestion.   Cardiovascular: Negative for chest pain.  Gastrointestinal: Negative for abdominal pain, nausea and vomiting.  Neurological: Negative for headaches.   Other systems negative  Physical Exam   Patient Vitals for the past 24 hrs:  BP Temp Pulse Resp  SpO2  09/19/19 2204 113/67 98.2 F (36.8 C) 95 18 99 %   Constitutional: Well-developed, well-nourished female in no acute distress.  Cardiovascular: normal rate and rhythm Respiratory: normal effort, clear to auscultation bilaterally GI: Abd soft, non-tender, gravid appropriate for gestational age.   No rebound or guarding. MS: Extremities nontender, no edema, normal ROM Neurologic: Alert and oriented x 4.  GU: Neg CVAT.  PELVIC EXAM: deferred  FHT:  Baseline 140 , moderate variability, accelerations present, no decelerations Contractions: Irregular     Labs:    Imaging:  BPP done and score was 8/8 (10/10 with reactive NST)  MAU Course/MDM: I have ordered BPP and reviewed results.  NST reviewed  and has been reactive throughout. Frequent fetal movement is audible but patient does not feel it all.  Dr Ceasar Lund updated.    Assessment: No diagnosis found.  Plan: Discharge home Labor precautions and fetal kick counts Follow up in Office for prenatal visits and recheck of status  Encouraged to return here or to other Urgent Care/ED if she develops worsening of symptoms, increase in pain, fever, or other concerning symptoms.   Pt stable at time of discharge.  Wynelle Bourgeois CNM, MSN Certified Nurse-Midwife 09/19/2019 10:26 PM

## 2019-09-19 NOTE — MAU Note (Signed)
Sydney Nichols is a 29 y.o. at [redacted]w[redacted]d here in MAU reporting: had an OB appointment and BP was 150/80 and was sent in for evaluation. No hx of HTN. No headache, visual changes, or RUQ pain. DFM.  Onset of complaint: today  Pain score: 0/10  Vitals:   09/19/19 1810  BP: 123/77  Pulse: (!) 113  Resp: 16  SpO2: 100%     FHT:138  Lab orders placed from triage: UA

## 2019-09-19 NOTE — MAU Provider Note (Signed)
History     CSN: [id number]  Arrival date and time: 09/19/19 1745   First Provider Initiated Contact with Patient 09/19/19 1844      Chief Complaint  Patient presents with  . Hypertension  . Decreased Fetal Movement   Sydney Nichols is a 29 y.o. G1P0 at [redacted]w[redacted]d who receives care at [facility].  She presents today for Hypertension and Decreased Fetal Movement.  She was seen in the office and was noted to have some elevated blood pressures.  However, patient denies HA, visual disturbances, SOB, and peripheral edema. Patient endorses fetal movement since arrival, but states was "very active yesterday and today it is."  Patient denies issues during the pregnancy, but states she had "gotten really sick after she ate" in the 3rd trimester, which was resolved with zofran.  Patient also reports that she has seen a hemotologist for low iron, but no interventions were implemented.    OB History    Gravida  1   Para      Term      Preterm      AB      Living        SAB      TAB      Ectopic      Multiple      Live Births              Past Medical History:  Diagnosis Date  . Acne   . Allergy    RHINITIS  . Eczema   . Endometriosis   . PCOS (polycystic ovarian syndrome)     History reviewed. No pertinent surgical history.  Family History  Problem Relation Age of Onset  . Thyroid disease Father   . Brain cancer Paternal Aunt   . Diabetes Maternal Grandfather     Social History   Tobacco Use  . Smoking status: Never Smoker  . Smokeless tobacco: Never Used  Substance Use Topics  . Alcohol use: No  . Drug use: No    Allergies: No Known Allergies  Medications Prior to Admission  Medication Sig Dispense Refill Last Dose  . docusate sodium (COLACE) 100 MG capsule Take 100 mg by mouth as needed for mild constipation.   Past Week at Unknown time  . ferrous sulfate 325 (65 FE) MG tablet Take 325 mg by mouth 2 (two) times daily with a meal.   09/19/2019 at 0830   . ondansetron (ZOFRAN-ODT) 4 MG disintegrating tablet Take 4 mg by mouth every 8 (eight) hours as needed for nausea or vomiting.   09/18/2019 at [place]  . Prenatal Vit-Fe Fumarate-FA (MULTIVITAMIN-PRENATAL) 27-0.8 MG TABS tablet Take 1 tablet by mouth daily at 12 noon.   09/19/2019 at 0830    Review of Systems  Constitutional: Negative for chills and fever.  Respiratory: Negative for cough and shortness of breath.   Gastrointestinal: Negative for abdominal pain, nausea and vomiting.  Genitourinary: Negative for difficulty urinating, dysuria, vaginal bleeding and vaginal discharge.  Musculoskeletal: Positive for back pain.  Neurological: Negative for dizziness, light-headedness and headaches.   Physical Exam   Blood pressure 130/79, pulse (!) 103, resp. rate 16, height 5' 5.5" (1.664 m), weight 94.7 kg, SpO2 100 %.  Physical Exam  Constitutional: She is oriented to person, place, and time. She appears well-developed and well-nourished. She appears distressed (Appears fatigued).  HENT:  Head: Normocephalic and atraumatic.  Eyes: Conjunctivae are normal.  Dark circles around eyes.   Cardiovascular: Normal rate, regular rhythm and  normal heart sounds.  Respiratory: Effort normal and breath sounds normal.  GI: Soft. Bowel sounds are normal. There is no abdominal tenderness.  Gravid--fundal height appears AGA, Soft, NT   Musculoskeletal:        General: Normal range of motion.     Cervical back: Normal range of motion.  Neurological: She is alert and oriented to person, place, and time.  Skin: Skin is warm and dry.  Psychiatric: She has a normal mood and affect. Her behavior is normal.    Fetal Assessment 155 bpm, Mod Var, -Decels, +Accels Toco: None graphed  MAU Course   Results for orders placed or performed during the hospital encounter of 09/19/19 (from the past 24 hour(s))  Protein / creatinine ratio, urine     Status: None   Collection Time: 09/19/19  6:30 PM  Result Value  Ref Range   Creatinine, Urine 29.98 mg/dL   Total Protein, Urine <6 mg/dL   Protein Creatinine Ratio        0.00 - 0.15 mg/mg[Cre]  Urinalysis, Routine w reflex microscopic     Status: Abnormal   Collection Time: 09/19/19  6:35 PM  Result Value Ref Range   Color, Urine STRAW (A) YELLOW   APPearance HAZY (A) CLEAR   Specific Gravity, Urine 1.002 (L) 1.005 - 1.030   pH 7.0 5.0 - 8.0   Glucose, UA NEGATIVE NEGATIVE mg/dL   Hgb urine dipstick NEGATIVE NEGATIVE   Bilirubin Urine NEGATIVE NEGATIVE   Ketones, ur NEGATIVE NEGATIVE mg/dL   Protein, ur NEGATIVE NEGATIVE mg/dL   Nitrite NEGATIVE NEGATIVE   Leukocytes,Ua NEGATIVE NEGATIVE  CBC     Status: Abnormal   Collection Time: 09/19/19  6:45 PM  Result Value Ref Range   WBC 16.4 (H) 4.0 - 10.5 K/uL   RBC 3.86 (L) 3.87 - 5.11 MIL/uL   Hemoglobin 8.7 (L) 12.0 - 15.0 g/dL   HCT 20.1 (L) 00.7 - 12.1 %   MCV 71.2 (L) 80.0 - 100.0 fL   MCH 22.5 (L) 26.0 - 34.0 pg   MCHC 31.6 30.0 - 36.0 g/dL   RDW 97.5 (H) 88.3 - 25.4 %   Platelets 351 150 - 400 K/uL   nRBC 0.0 0.0 - 0.2 %  Comprehensive metabolic panel     Status: Abnormal   Collection Time: 09/19/19  6:45 PM  Result Value Ref Range   Sodium 135 135 - 145 mmol/L   Potassium 3.9 3.5 - 5.1 mmol/L   Chloride 102 98 - 111 mmol/L   CO2 24 22 - 32 mmol/L   Glucose, Bld 78 70 - 99 mg/dL   BUN 5 (L) 6 - 20 mg/dL   Creatinine, Ser 9.82 0.44 - 1.00 mg/dL   Calcium 64.1 8.9 - 58.3 mg/dL   Total Protein 6.9 6.5 - 8.1 g/dL   Albumin 3.0 (L) 3.5 - 5.0 g/dL   AST 21 15 - 41 U/L   ALT 16 0 - 44 U/L   Alkaline Phosphatase 178 (H) 38 - 126 U/L   Total Bilirubin 0.3 0.3 - 1.2 mg/dL   GFR calc non Af Amer >60 >60 mL/min   GFR calc Af Amer >60 >60 mL/min   Anion gap 9 5 - 15   No results found.  MDM Physical Exam Labs: CBC, CMP, PC Ratio Measure BPQ15 min EFM Pain Management Assessment and Plan  29 year old G1P0  SIUP at 36.6 weeks Cat I FT Elevated BP   -Exam findings  discussed. -Labs  ordered. -Discussed potential admission with IOL if labs return significant for PreEclampsia. -Patient without questions or concerns currently. -Will await results.   Cherre Robins MSN, CNM 09/19/2019, 6:44 PM   Reassessment (7:31 AM) -Nurse reports patient c/o HA and requests tylenol. -Okay for tylenol 1g now.  -Labs pending.  Reassessment (8:03 PM)  -Lab returns as above. -Patient reports improvement of HA with tylenol dosing. -Patient informed of discharge today and need to follow up as scheduled. -Patient questions when she will be induced and informed that that is at the discretion of her primary ob. -Encouraged to call or return to MAU if symptoms worsen or with the onset of new symptoms. -Discharged to home in stable condition.  Cherre Robins MSN, CNM Advanced Practice Provider, [facility] [employer]

## 2019-09-19 NOTE — Discharge Instructions (Signed)
Nonstress Test A nonstress test is a procedure that is done during pregnancy in order to check the baby's heartbeat. The procedure can help show if the baby (fetus) is healthy. It is commonly done when:  The baby is past his or her due date.  The pregnancy is high risk.  The baby is moving less than normal.  The mother has lost a pregnancy in the past.  The health care provider suspects a problem with the baby's growth.  There is too much or too little amniotic fluid. The procedure is often done in the third trimester of pregnancy to find out if an early delivery is needed and whether such a delivery is safe. During a nonstress test, the baby's heartbeat is monitored when the baby is resting and when the baby is moving. If the baby is healthy, the heart rate will increase when he or she moves or kicks and will return to normal when he or she rests. Tell a health care provider about:  Any allergies you have.  Any medical conditions you have.  All medicines you are taking, including vitamins, herbs, eye drops, creams, and over-the-counter medicines. What are the risks? There are no risks to you or your baby from a nonstress test. This procedure should not be painful or uncomfortable. What happens before the procedure?  Eat a meal right before the test or as directed by your health care provider. Food may help encourage the baby to move.  Use the restroom right before the test. What happens during the procedure?  Two monitors will be placed on your abdomen. One will record the baby's heart rate and the other will record the contractions of your uterus.  You may be asked to lie down on your side or to sit upright.  You may be given a button to press when you feel your baby move.  Your health care provider will listen to your baby's heartbeat and recorded it. He or she may also watch your baby's heartbeat on a screen.  If the baby seems to be sleeping, you may be asked to drink  some juice or soda, eat a snack, or change positions. The procedure may vary among health care providers and hospitals. What happens after the procedure?  Your health care provider will discuss the test results with you and make recommendations for the future. Depending on the results, your health care provider may order additional tests or another course of action.  If your health care provider gave you any diet or activity instructions, make sure to follow them.  Keep all follow-up visits as told by your health care provider. This is important. Summary  A nonstress test is a procedure that is done during pregnancy in order to check the baby's heartbeat. The procedure can help show if the baby is healthy.  The procedure is often done in the third trimester of pregnancy to find out if an early delivery is needed and whether such a delivery is safe.  During a nonstress test, the baby's heartbeat is monitored when the baby is resting and when the baby is moving. If the baby is healthy, the heart rate will increase when he or she moves or kicks and will return to normal when he or she rests.  Your health care provider will discuss the test results with you and make recommendations for the future. This information is not intended to replace advice given to you by your health care provider. Make sure you discuss any   questions you have with your health care provider. Document Revised: 10/07/2016 Document Reviewed: 10/07/2016 Elsevier Patient Education  [place] A biophysical profile is a non-invasive test that may be done during pregnancy to check that your developing baby (fetus) and your placenta are healthy. Your health care provider may recommend a biophysical profile if your pregnancy is at a higher risk for certain problems. A biophysical profile is usually done during the last 3 months of pregnancy (third trimester). A biophysical profile combines two tests to  check the health of your baby. In one test, you will have a device strapped to your belly to measure your baby's heart rate. The other test involves using sound waves and a computer (ultrasound) to create an image of your baby inside your womb (uterus). Together, these tests tell your health care provider about the overall health of your baby. Tell a health care provider about:  Any allergies you have.  All medicines you are taking, including vitamins, herbs, eye drops, creams, and over-the-counter medicines.  Any medical conditions you have.  Any concerns you have about your pregnancy.  Any symptoms such as abdominal pain or contractions, nausea or vomiting, vaginal bleeding, leaking of amniotic fluid, decreased fetal movements, fever or infection, increased swelling, headaches, or visual disturbances.  How often you feel your baby move. What are the risks? There are no risks to you or your baby from a biophysical profile. What happens before the procedure? Ask your health care provider how to prepare.  You may need to drink fluids so that you have a full bladder for your ultrasound.  You may also need to eat before you arrive for the test. That makes your baby more active. What happens during the procedure?      You will lie on your back on an exam table.  Your blood pressure may be monitored during the procedure.  A belt will be placed around your belly. The belt has a sensor to measure your baby's heart rate.  You may have to wear another belt and sensor to measure any muscle movements (contractions) in your uterus. During the ultrasound, a health care provider or technician will gently roll a handheld device (transducer) over your belly. This device sends signals to a computer that creates images of your baby.  Five areas of your baby's health and development will be checked during the biophysical profile: ? Heart rate. ? Breathing. ? Movement. ? Active muscle movement  (muscle tone). ? The amount of fluid in your uterus (amniotic fluid). The procedure may vary among health care providers and hospitals. What happens after the procedure?  Your health care provider will discuss your results with you. The results of a biophysical profile are scored in a range of 0 to 10. Each area that is evaluated is given a score of 0 or 2 points. If you get a score of 6 or less, you may need further testing, or your baby may need to be delivered early. A score of 8 to 10 with normal amniotic fluid levels is considered normal.  Unless you need additional testing, you can go home right after the procedure and resume your normal activities. Summary  A biophysical profile is a non-invasive test that may be done during pregnancy to check that your developing baby (fetus) and your placenta are healthy.  A biophysical profile combines two tests: A test to measure your baby's heart rate and an ultrasound test to create an image of your  baby in the womb.  Tell your health care provider about any concerns you have about your pregnancy or any pregnancy-related symptoms.  If you get a score of 6 or less, you may need further testing, or your baby may need to be delivered early. A score of 8 to 10 with normal amniotic fluid levels is considered normal. This information is not intended to replace advice given to you by your health care provider. Make sure you discuss any questions you have with your health care provider. Document Revised: 10/18/2018 Document Reviewed: 07/30/2016 Elsevier Patient Education  2020 [employer].

## 2019-09-19 NOTE — MAU Note (Addendum)
Pt states she was here earlier and was sent home around 8pm. States she has a lot of anxiety, but is worried about baby as he is not moving like usual. Has felt baby move since she was discharge home, but still has concerns. States she called doctor and was told to "come in for ultrasound".

## 2019-09-21 ENCOUNTER — Inpatient Hospital Stay (HOSPITAL_COMMUNITY): Payer: BC Managed Care – PPO | Admitting: Anesthesiology

## 2019-09-21 ENCOUNTER — Encounter (HOSPITAL_COMMUNITY): Payer: Self-pay | Admitting: Obstetrics and Gynecology

## 2019-09-21 ENCOUNTER — Telehealth: Payer: Self-pay | Admitting: *Deleted

## 2019-09-21 ENCOUNTER — Other Ambulatory Visit: Payer: Self-pay

## 2019-09-21 ENCOUNTER — Inpatient Hospital Stay (HOSPITAL_COMMUNITY)
Admission: RE | Admit: 2019-09-21 | Discharge: 2019-09-25 | DRG: 787 | Disposition: A | Discharge status: Home / Self Care | Payer: BC Managed Care – PPO | Attending: Obstetrics and Gynecology | Admitting: Obstetrics and Gynecology

## 2019-09-21 DIAGNOSIS — O134 Gestational [pregnancy-induced] hypertension without significant proteinuria, complicating childbirth: Secondary | ICD-10-CM | POA: Diagnosis not present

## 2019-09-21 DIAGNOSIS — O9081 Anemia of the puerperium: Secondary | ICD-10-CM | POA: Diagnosis not present

## 2019-09-21 DIAGNOSIS — Z20822 Contact with and (suspected) exposure to covid-19: Secondary | ICD-10-CM | POA: Diagnosis not present

## 2019-09-21 DIAGNOSIS — Z3A37 37 weeks gestation of pregnancy: Secondary | ICD-10-CM | POA: Diagnosis not present

## 2019-09-21 DIAGNOSIS — D62 Acute posthemorrhagic anemia: Secondary | ICD-10-CM | POA: Diagnosis not present

## 2019-09-21 DIAGNOSIS — O368190 Decreased fetal movements, unspecified trimester, not applicable or unspecified: Secondary | ICD-10-CM | POA: Diagnosis not present

## 2019-09-21 DIAGNOSIS — Z3490 Encounter for supervision of normal pregnancy, unspecified, unspecified trimester: Secondary | ICD-10-CM

## 2019-09-21 DIAGNOSIS — O139 Gestational [pregnancy-induced] hypertension without significant proteinuria, unspecified trimester: Secondary | ICD-10-CM | POA: Diagnosis not present

## 2019-09-21 DIAGNOSIS — Z3A00 Weeks of gestation of pregnancy not specified: Secondary | ICD-10-CM | POA: Diagnosis not present

## 2019-09-21 DIAGNOSIS — Z98891 History of uterine scar from previous surgery: Secondary | ICD-10-CM

## 2019-09-21 HISTORY — DX: Encounter for supervision of normal pregnancy, unspecified, unspecified trimester: Z34.90

## 2019-09-21 HISTORY — DX: Gestational (pregnancy-induced) hypertension without significant proteinuria, unspecified trimester: O13.9

## 2019-09-21 HISTORY — DX: Other specified health status: Z78.9

## 2019-09-21 LAB — COMPREHENSIVE METABOLIC PANEL
ALT: 12 U/L (ref 0–44)
AST: 21 U/L (ref 15–41)
Albumin: 2.8 g/dL — ABNORMAL LOW (ref 3.5–5.0)
Alkaline Phosphatase: 209 U/L — ABNORMAL HIGH (ref 38–126)
Anion gap: 10 (ref 5–15)
BUN: 7 mg/dL (ref 6–20)
CO2: 23 mmol/L (ref 22–32)
Calcium: 10.4 mg/dL — ABNORMAL HIGH (ref 8.9–10.3)
Chloride: 104 mmol/L (ref 98–111)
Creatinine, Ser: 0.93 mg/dL (ref 0.44–1.00)
GFR calc Af Amer: >60 mL/min (ref >60)
GFR calc non Af Amer: >60 mL/min (ref >60)
Glucose, Bld: 89 mg/dL (ref 70–99)
Potassium: 4.4 mmol/L (ref 3.5–5.1)
Sodium: 137 mmol/L (ref 135–145)
Total Bilirubin: 0.4 mg/dL (ref 0.3–1.2)
Total Protein: 6.8 g/dL (ref 6.5–8.1)

## 2019-09-21 LAB — CBC
HCT: 28.1 % — ABNORMAL LOW (ref 36.0–46.0)
Hemoglobin: 8.6 g/dL — ABNORMAL LOW (ref 12.0–15.0)
MCH: 22.4 pg — ABNORMAL LOW (ref 26.0–34.0)
MCHC: 30.6 g/dL (ref 30.0–36.0)
MCV: 73.2 fL — ABNORMAL LOW (ref 80.0–100.0)
Platelets: 338 10*3/uL (ref 150–400)
RBC: 3.84 MIL/uL — ABNORMAL LOW (ref 3.87–5.11)
RDW: 16.4 % — ABNORMAL HIGH (ref 11.5–15.5)
WBC: 16.8 10*3/uL — ABNORMAL HIGH (ref 4.0–10.5)
nRBC: 0 % (ref 0.0–0.2)

## 2019-09-21 LAB — PROTEIN / CREATININE RATIO, URINE
Creatinine, Urine: 97.58 mg/dL
Protein Creatinine Ratio: 0.07 mg/mg{Cre} (ref 0.00–0.15)
Total Protein, Urine: 7 mg/dL

## 2019-09-21 LAB — SARS CORONAVIRUS 2 (TAT 6-24 HRS): SARS Coronavirus 2: NEGATIVE

## 2019-09-21 LAB — GROUP B STREP BY PCR: Group B strep by PCR: NEGATIVE

## 2019-09-21 LAB — ABO/RH: ABO/RH(D): O POS

## 2019-09-21 MED ORDER — OXYTOCIN 40 UNITS IN NORMAL SALINE INFUSION - SIMPLE MED: 2.5000 [IU]/h | INTRAVENOUS | Status: DC

## 2019-09-21 MED ORDER — PROMETHAZINE HCL 25 MG/ML IJ SOLN: 12.5000 mg | Freq: Four times a day (QID) | INTRAMUSCULAR | Status: DC | PRN

## 2019-09-21 MED ORDER — OXYCODONE-ACETAMINOPHEN 5-325 MG PO TABS: 1.0000 | ORAL_TABLET | ORAL | Status: DC | PRN

## 2019-09-21 MED ORDER — EPHEDRINE 5 MG/ML INJ
10.0000 mg | INTRAVENOUS | Status: DC | PRN
  Filled 2019-09-21: qty 10

## 2019-09-21 MED ORDER — TERBUTALINE SULFATE 1 MG/ML IJ SOLN: 0.2500 mg | Freq: Once | INTRAMUSCULAR | Status: DC | PRN

## 2019-09-21 MED ORDER — LACTATED RINGERS IV SOLN: INTRAVENOUS | Status: DC

## 2019-09-21 MED ORDER — MISOPROSTOL 50MCG HALF TABLET
50.0000 ug | ORAL_TABLET | ORAL | Status: DC
  Administered 2019-09-21: 50 ug via ORAL

## 2019-09-21 MED ORDER — SODIUM CHLORIDE (PF) 0.9 % IJ SOLN
INTRAMUSCULAR | Status: DC | PRN
  Administered 2019-09-21: 12 mL/h via EPIDURAL

## 2019-09-21 MED ORDER — SOD CITRATE-CITRIC ACID 500-334 MG/5ML PO SOLN
30.0000 mL | ORAL | Status: DC | PRN
  Administered 2019-09-21 – 2019-09-22 (×2): 30 mL via ORAL
  Filled 2019-09-21 (×2): qty 30

## 2019-09-21 MED ORDER — PHENYLEPHRINE 40 MCG/ML (10ML) SYRINGE FOR IV PUSH (FOR BLOOD PRESSURE SUPPORT)
80.0000 ug | PREFILLED_SYRINGE | INTRAVENOUS | Status: DC | PRN
  Filled 2019-09-21: qty 10

## 2019-09-21 MED ORDER — OXYTOCIN BOLUS FROM INFUSION: 500.0000 mL | Freq: Once | INTRAVENOUS | Status: DC

## 2019-09-21 MED ORDER — EPHEDRINE 5 MG/ML INJ
10.0000 mg | INTRAVENOUS | Status: AC | PRN
  Administered 2019-09-22 (×2): 10 mg via INTRAVENOUS

## 2019-09-21 MED ORDER — MISOPROSTOL 100 MCG PO TABS: 25.0000 ug | ORAL_TABLET | Freq: Three times a day (TID) | ORAL | Status: DC

## 2019-09-21 MED ORDER — PHENYLEPHRINE 40 MCG/ML (10ML) SYRINGE FOR IV PUSH (FOR BLOOD PRESSURE SUPPORT)
80.0000 ug | PREFILLED_SYRINGE | INTRAVENOUS | Status: AC | PRN
  Administered 2019-09-21 – 2019-09-22 (×3): 80 ug via INTRAVENOUS

## 2019-09-21 MED ORDER — OXYCODONE-ACETAMINOPHEN 5-325 MG PO TABS: 2.0000 | ORAL_TABLET | ORAL | Status: DC | PRN

## 2019-09-21 MED ORDER — MISOPROSTOL 50MCG HALF TABLET
ORAL_TABLET | ORAL | Status: AC
  Filled 2019-09-21: qty 1

## 2019-09-21 MED ORDER — LACTATED RINGERS IV SOLN: 500.0000 mL | INTRAVENOUS | Status: DC | PRN

## 2019-09-21 MED ORDER — BUTORPHANOL TARTRATE 1 MG/ML IJ SOLN
1.0000 mg | INTRAMUSCULAR | Status: DC | PRN
  Administered 2019-09-21: 1 mg via INTRAVENOUS

## 2019-09-21 MED ORDER — ACETAMINOPHEN 325 MG PO TABS
650.0000 mg | ORAL_TABLET | ORAL | Status: DC | PRN
  Administered 2019-09-22: 10:00:00 650 mg via ORAL
  Filled 2019-09-21: qty 2

## 2019-09-21 MED ORDER — FENTANYL-BUPIVACAINE-NACL 0.5-0.125-0.9 MG/250ML-% EP SOLN
12.0000 mL/h | EPIDURAL | Status: DC | PRN
  Filled 2019-09-21: qty 250

## 2019-09-21 MED ORDER — LIDOCAINE HCL (PF) 1 % IJ SOLN
INTRAMUSCULAR | Status: DC | PRN
  Administered 2019-09-21 (×2): 4 mL

## 2019-09-21 MED ORDER — DIPHENHYDRAMINE HCL 50 MG/ML IJ SOLN: 12.5000 mg | INTRAMUSCULAR | Status: DC | PRN

## 2019-09-21 MED ORDER — LACTATED RINGERS IV SOLN
500.0000 mL | Freq: Once | INTRAVENOUS | Status: AC
  Administered 2019-09-21: 500 mL via INTRAVENOUS

## 2019-09-21 MED ORDER — MISOPROSTOL 50MCG HALF TABLET: 50.0000 ug | ORAL_TABLET | Freq: Three times a day (TID) | ORAL | Status: DC

## 2019-09-21 MED ORDER — FLEET ENEMA 7-19 GM/118ML RE ENEM: 1.0000 | ENEMA | RECTAL | Status: DC | PRN

## 2019-09-21 MED ORDER — LIDOCAINE HCL (PF) 1 % IJ SOLN: 30.0000 mL | INTRAMUSCULAR | Status: DC | PRN

## 2019-09-21 MED ORDER — BUTORPHANOL TARTRATE 1 MG/ML IJ SOLN
INTRAMUSCULAR | Status: AC
  Filled 2019-09-21: qty 1

## 2019-09-21 MED ORDER — ONDANSETRON HCL 4 MG/2ML IJ SOLN: 4.0000 mg | Freq: Four times a day (QID) | INTRAMUSCULAR | Status: DC | PRN

## 2019-09-21 NOTE — Progress Notes (Signed)
Patient ID: Sydney Nichols, female   DOB: 12-17-1990, 29 y.o.   MRN: [id number]  Uncomfortable/pressure w foley bulb  AFVSS gen uncomf, but appropriate FHTs 120's mod var, +accels, category 1 toco irr  SVE 2cm foley bulb coming thru cervix  GBBS collected D/W pt source of pain - will give another 1mg  of Stadol and phenergan to help her rest.    Expect SVD

## 2019-09-21 NOTE — Progress Notes (Signed)
Patient ID: Sydney Nichols, female   DOB: 02-Sep-1990, 29 y.o.   MRN: [id number]   Comfortable w epidural  AFVSS gen NAD, sleepy FHTs 120- 130's mod var, + accels, category 1 toco q 2-3 min  SVE foley bulb still present - 3.4/90 Continue IOL, s/p cytotec, foley bulb GBBS neg

## 2019-09-21 NOTE — Telephone Encounter (Signed)
-----   Message from Jaci Standard, MD sent at 09/20/2019  9:02 AM EST ----- Please call [name] to let her know her findings are most consistent with iron deficiency anemia. She has low levels of vitamin b12 as well. We can call in some vitamin b12 tablets to a pharmacy of her choosing. Recommend continuing iron pills and following up with [place] approximately 4 weeks after delivery.   Sydney Nichols ----- Message ----- From: Leory Plowman, Lab In [place] Sent: 09/10/2019   2:02 PM EST To: Jaci Standard, MD

## 2019-09-21 NOTE — Anesthesia Preprocedure Evaluation (Signed)
Anesthesia Evaluation  Patient identified by MRN, date of birth, ID band Patient awake    Reviewed: Allergy & Precautions, NPO status , Patient's Chart, lab work & pertinent test results  Airway Mallampati: II  TM Distance: >3 FB Neck ROM: Full    Dental   Pulmonary neg pulmonary ROS,    Pulmonary exam normal breath sounds clear to auscultation       Cardiovascular hypertension, negative cardio ROS Normal cardiovascular exam Rhythm:Regular Rate:Normal     Neuro/Psych negative neurological ROS  negative psych ROS   GI/Hepatic negative GI ROS, Neg liver ROS,   Endo/Other  negative endocrine ROS  Renal/GU negative Renal ROS     Musculoskeletal negative musculoskeletal ROS (+)   Abdominal (+) + obese,   Peds  Hematology negative hematology ROS (+)   Anesthesia Other Findings   Reproductive/Obstetrics (+) Pregnancy                             Anesthesia Physical Anesthesia Plan  ASA: II  Anesthesia Plan: Epidural   Post-op Pain Management:    Induction:   PONV Risk Score and Plan:   Airway Management Planned:   Additional Equipment:   Intra-op Plan:   Post-operative Plan:   Informed Consent: I have reviewed the patients History and Physical, chart, labs and discussed the procedure including the risks, benefits and alternatives for the proposed anesthesia with the patient or authorized representative who has indicated his/her understanding and acceptance.       Plan Discussed with:   Anesthesia Plan Comments:         Anesthesia Quick Evaluation

## 2019-09-21 NOTE — Telephone Encounter (Signed)
Pt currently in patient for delivery

## 2019-09-21 NOTE — H&P (Signed)
Sydney Nichols is a 29 y.o. female G1P0 at 40+ w PIH (140/80 for several days) also decreased FM (has had normal antenatal testing).  Pregnancy is secondary to IVF.  Relatively uncomplicated PNC.  Received Tdap 1/11.  Pregnancy dated by [facility].    OB History    Gravida  1   Para      Term      Preterm      AB      Living        SAB      TAB      Ectopic      Multiple      Live Births            G1 present  PCOS No abn pap No STDs  Past Medical History:  Diagnosis Date  . Acne   . Allergy    RHINITIS  . Eczema   . Endometriosis   . Medical history non-contributory   . PCOS (polycystic ovarian syndrome)   . PIH (pregnancy induced hypertension) 09/21/2019   History reviewed. No pertinent surgical history. Family History: family history includes Brain cancer in her paternal aunt; Cancer in her maternal grandmother; Diabetes in her maternal grandfather and paternal grandfather; Heart disease in her paternal grandfather; Thyroid disease in her father. Social History:  reports that she has never smoked. She has never used smokeless tobacco. She reports that she does not drink alcohol or use drugs. married, Pharmacist, hospital  Meds: Iron, Zofran, PNV All NKDA     Maternal Diabetes: No Genetic Screening: Normal Maternal Ultrasounds/Referrals: Normal Fetal Ultrasounds or other Referrals:  None Maternal Substance Abuse:  No Significant Maternal Medications:  None Significant Maternal Lab Results:  None GBBS unknown Other Comments:  None  Review of Systems  Constitutional: Negative.   HENT: Negative.   Respiratory: Negative.   Cardiovascular: Negative.   Gastrointestinal: Negative.   Genitourinary: Negative.   Musculoskeletal: Negative.   Skin: Negative.   Neurological: Negative.   Psychiatric/Behavioral: Negative.    Maternal Medical History:  Fetal activity: Perceived fetal activity is normal.    Prenatal Complications - Diabetes: none.    Dilation:  1 Effacement (%): Thick Station: Ballotable Exam by:: Wonda Cheng RN  Blood pressure 118/77, pulse (!) 123, temperature (!) 97.5 F (36.4 C), temperature source Oral, resp. rate 18, height 5\' 4"  (1.626 m), weight 93 kg. Maternal Exam:  Abdomen: Patient reports no abdominal tenderness. Fundal height is appropriate for gestation.   Estimated fetal weight is 7-8#.   Fetal presentation: vertex  Introitus: Normal vulva. Normal vagina.    Physical Exam  Constitutional: She is oriented to person, place, and time. She appears well-developed and well-nourished.  HENT:  Head: Normocephalic and atraumatic.  Cardiovascular: Normal rate and regular rhythm.  Respiratory: Effort normal and breath sounds normal. No respiratory distress. She has no wheezes.  GI: Soft. Bowel sounds are normal. She exhibits no distension. There is no abdominal tenderness.  Genitourinary:    Vulva normal.   Musculoskeletal:        General: Normal range of motion.  Neurological: She is alert and oriented to person, place, and time.  Skin: Skin is warm and dry.  Psychiatric: She has a normal mood and affect. Her behavior is normal.    Prenatal labs: ABO, Rh: --/--/PENDING (03/12 1430) O+ Antibody: PENDING (03/12 1430) neg Rubella:  immune RPR:   NR HBsAg:   neg HIV:   neg GBS:   unknown  Hgb 11.2-8.8-9.3/Plt 400/Ur  Cx neg/GC neg/Chl neg/Varicella immune/Hgb electro WNL/ glucola 134- nl 3hr GTT/Hep C neg  Dated by DOC/IVF Tdap 1/11 Neg carrier screen w Dr Jeannie Fend, PGD Vtx, ant plac, limited nl anat Completed nl anat Good growth on [place]  Assessment/Plan: 28yo G1P0 at 37+ with decreased FM and PIH/GHTN for IOL D/w pt r/b/a and process Cytotec, foley bulb, AROM, pitocin to induce Epidural/stadol prn expect SVD   Sydney Nichols 09/21/2019, 3:17 PM

## 2019-09-21 NOTE — Anesthesia Procedure Notes (Signed)
Epidural Patient location during procedure: OB Start time: 09/21/2019 8:04 PM End time: 09/21/2019 8:20 PM  Staffing Anesthesiologist: Lewie Loron, MD Performed: anesthesiologist   Preanesthetic Checklist Completed: patient identified, IV checked, risks and benefits discussed, monitors and equipment checked, pre-op evaluation and timeout performed  Epidural Patient position: sitting Prep: DuraPrep and site prepped and draped Patient monitoring: heart rate, continuous pulse ox and blood pressure Approach: midline Location: L2-L3 Injection technique: LOR air and LOR saline  Needle:  Needle type: Tuohy  Needle gauge: 17 G Needle length: 9 cm Needle insertion depth: 7 cm Catheter type: closed end flexible Catheter size: 19 Gauge Catheter at skin depth: 12 cm Test dose: negative  Assessment Sensory level: T8 Events: blood not aspirated, injection not painful, no injection resistance, no paresthesia and negative IV test  Additional Notes Reason for block:procedure for pain

## 2019-09-21 NOTE — Progress Notes (Signed)
Patient ID: Sydney Nichols, female   DOB: 06/21/1991, 29 y.o.   MRN: [id number]   H&P reviewed, no changes.    AFVSS gen NAD FHTs 130-140, mod var, + accels, category 1 toco irr/ UI  1 per RN exam Foley bulb placed w/o diff/comp  Also given cytotec Continue IOL

## 2019-09-22 ENCOUNTER — Encounter (HOSPITAL_COMMUNITY): Payer: Self-pay | Admitting: Obstetrics and Gynecology

## 2019-09-22 ENCOUNTER — Encounter (HOSPITAL_COMMUNITY)
Admission: RE | Disposition: A | Discharge status: Home / Self Care | Payer: Self-pay | Source: Home / Self Care | Attending: Obstetrics and Gynecology

## 2019-09-22 DIAGNOSIS — Z98891 History of uterine scar from previous surgery: Secondary | ICD-10-CM

## 2019-09-22 HISTORY — DX: History of uterine scar from previous surgery: Z98.891

## 2019-09-22 LAB — CBC
HCT: 22.2 % — ABNORMAL LOW (ref 36.0–46.0)
Hemoglobin: 6.9 g/dL — CL (ref 12.0–15.0)
MCH: 22.5 pg — ABNORMAL LOW (ref 26.0–34.0)
MCHC: 31.1 g/dL (ref 30.0–36.0)
MCV: 72.5 fL — ABNORMAL LOW (ref 80.0–100.0)
Platelets: 277 10*3/uL (ref 150–400)
RBC: 3.06 MIL/uL — ABNORMAL LOW (ref 3.87–5.11)
RDW: 16.7 % — ABNORMAL HIGH (ref 11.5–15.5)
WBC: 23.9 10*3/uL — ABNORMAL HIGH (ref 4.0–10.5)
nRBC: 0 % (ref 0.0–0.2)

## 2019-09-22 LAB — RPR: RPR Ser Ql: NONREACTIVE

## 2019-09-22 LAB — PREPARE RBC (CROSSMATCH)

## 2019-09-22 SURGERY — Surgical Case
Anesthesia: Epidural

## 2019-09-22 MED ORDER — OXYCODONE HCL 5 MG PO TABS: 5.0000 mg | ORAL_TABLET | Freq: Once | ORAL | Status: DC | PRN

## 2019-09-22 MED ORDER — CEFAZOLIN SODIUM-DEXTROSE 2-3 GM-%(50ML) IV SOLR
INTRAVENOUS | Status: DC | PRN
  Administered 2019-09-22: 2 g via INTRAVENOUS

## 2019-09-22 MED ORDER — KETOROLAC TROMETHAMINE 30 MG/ML IJ SOLN
30.0000 mg | Freq: Once | INTRAMUSCULAR | Status: AC
  Administered 2019-09-22: 30 mg via INTRAVENOUS

## 2019-09-22 MED ORDER — IBUPROFEN 800 MG PO TABS
800.0000 mg | ORAL_TABLET | Freq: Four times a day (QID) | ORAL | Status: DC
  Administered 2019-09-23 – 2019-09-25 (×9): 800 mg via ORAL
  Filled 2019-09-22 (×9): qty 1

## 2019-09-22 MED ORDER — MORPHINE SULFATE (PF) 0.5 MG/ML IJ SOLN
INTRAMUSCULAR | Status: AC
  Filled 2019-09-22: qty 10

## 2019-09-22 MED ORDER — LIDOCAINE-EPINEPHRINE (PF) 2 %-1:200000 IJ SOLN
INTRAMUSCULAR | Status: DC | PRN
  Administered 2019-09-22 (×3): 5 mL via INTRADERMAL

## 2019-09-22 MED ORDER — SODIUM CHLORIDE 0.9 % IV SOLN: INTRAVENOUS | Status: DC | PRN

## 2019-09-22 MED ORDER — ONDANSETRON HCL 4 MG/2ML IJ SOLN
INTRAMUSCULAR | Status: AC
  Filled 2019-09-22: qty 2

## 2019-09-22 MED ORDER — ACETAMINOPHEN 500 MG PO TABS
1000.0000 mg | ORAL_TABLET | Freq: Four times a day (QID) | ORAL | Status: DC
  Administered 2019-09-23 – 2019-09-25 (×10): 1000 mg via ORAL
  Filled 2019-09-22 (×11): qty 2

## 2019-09-22 MED ORDER — TERBUTALINE SULFATE 1 MG/ML IJ SOLN: 0.2500 mg | Freq: Once | INTRAMUSCULAR | Status: DC | PRN

## 2019-09-22 MED ORDER — OXYTOCIN 40 UNITS IN NORMAL SALINE INFUSION - SIMPLE MED
2.5000 [IU]/h | INTRAVENOUS | Status: AC
  Administered 2019-09-23: 2.5 [IU]/h via INTRAVENOUS
  Filled 2019-09-22: qty 1000

## 2019-09-22 MED ORDER — SIMETHICONE 80 MG PO CHEW: 80.0000 mg | CHEWABLE_TABLET | ORAL | Status: DC | PRN

## 2019-09-22 MED ORDER — LIDOCAINE-EPINEPHRINE (PF) 2 %-1:200000 IJ SOLN
INTRAMUSCULAR | Status: AC
  Filled 2019-09-22: qty 10

## 2019-09-22 MED ORDER — MEPERIDINE HCL 25 MG/ML IJ SOLN: 6.2500 mg | INTRAMUSCULAR | Status: DC | PRN

## 2019-09-22 MED ORDER — DEXAMETHASONE SODIUM PHOSPHATE 10 MG/ML IJ SOLN
INTRAMUSCULAR | Status: AC
  Filled 2019-09-22: qty 1

## 2019-09-22 MED ORDER — PRENATAL MULTIVITAMIN CH
1.0000 | ORAL_TABLET | Freq: Every day | ORAL | Status: DC
  Administered 2019-09-24 – 2019-09-25 (×2): 1 via ORAL
  Filled 2019-09-22 (×2): qty 1

## 2019-09-22 MED ORDER — NALOXONE HCL 0.4 MG/ML IJ SOLN: 0.4000 mg | INTRAMUSCULAR | Status: DC | PRN

## 2019-09-22 MED ORDER — OXYTOCIN 40 UNITS IN NORMAL SALINE INFUSION - SIMPLE MED
1.0000 m[IU]/min | INTRAVENOUS | Status: DC
  Administered 2019-09-22: 2 m[IU]/min via INTRAVENOUS
  Filled 2019-09-22: qty 1000

## 2019-09-22 MED ORDER — SODIUM CHLORIDE 0.9% IV SOLUTION: Freq: Once | INTRAVENOUS | Status: AC

## 2019-09-22 MED ORDER — WITCH HAZEL-GLYCERIN EX PADS: 1.0000 "application " | MEDICATED_PAD | CUTANEOUS | Status: DC | PRN

## 2019-09-22 MED ORDER — OXYTOCIN 40 UNITS IN NORMAL SALINE INFUSION - SIMPLE MED
INTRAVENOUS | Status: AC
  Filled 2019-09-22: qty 1000

## 2019-09-22 MED ORDER — SIMETHICONE 80 MG PO CHEW
80.0000 mg | CHEWABLE_TABLET | ORAL | Status: DC
  Administered 2019-09-23 – 2019-09-24 (×3): 80 mg via ORAL
  Filled 2019-09-22 (×3): qty 1

## 2019-09-22 MED ORDER — DIPHENHYDRAMINE HCL 25 MG PO CAPS
25.0000 mg | ORAL_CAPSULE | Freq: Once | ORAL | Status: AC
  Administered 2019-09-22: 19:00:00 25 mg via ORAL

## 2019-09-22 MED ORDER — SIMETHICONE 80 MG PO CHEW
80.0000 mg | CHEWABLE_TABLET | Freq: Three times a day (TID) | ORAL | Status: DC
  Administered 2019-09-23 – 2019-09-25 (×7): 80 mg via ORAL
  Filled 2019-09-22 (×7): qty 1

## 2019-09-22 MED ORDER — ZOLPIDEM TARTRATE 5 MG PO TABS: 5.0000 mg | ORAL_TABLET | Freq: Every evening | ORAL | Status: DC | PRN

## 2019-09-22 MED ORDER — MENTHOL 3 MG MT LOZG: 1.0000 | LOZENGE | OROMUCOSAL | Status: DC | PRN

## 2019-09-22 MED ORDER — SCOPOLAMINE 1 MG/3DAYS TD PT72: 1.0000 | MEDICATED_PATCH | Freq: Once | TRANSDERMAL | Status: DC

## 2019-09-22 MED ORDER — NALBUPHINE HCL 10 MG/ML IJ SOLN: 5.0000 mg | INTRAMUSCULAR | Status: DC | PRN

## 2019-09-22 MED ORDER — SODIUM CHLORIDE 0.9% FLUSH: 3.0000 mL | INTRAVENOUS | Status: DC | PRN

## 2019-09-22 MED ORDER — OXYCODONE HCL 5 MG PO TABS
5.0000 mg | ORAL_TABLET | ORAL | Status: DC | PRN
  Administered 2019-09-23 – 2019-09-25 (×5): 5 mg via ORAL
  Filled 2019-09-22 (×5): qty 1

## 2019-09-22 MED ORDER — OXYTOCIN 40 UNITS IN NORMAL SALINE INFUSION - SIMPLE MED
INTRAVENOUS | Status: DC | PRN
  Administered 2019-09-22: 40 [IU] via INTRAVENOUS

## 2019-09-22 MED ORDER — MORPHINE SULFATE (PF) 10 MG/ML IV SOLN: INTRAVENOUS | Status: DC | PRN

## 2019-09-22 MED ORDER — COCONUT OIL OIL: 1.0000 "application " | TOPICAL_OIL | Status: DC | PRN

## 2019-09-22 MED ORDER — OXYCODONE HCL 5 MG/5ML PO SOLN: 5.0000 mg | Freq: Once | ORAL | Status: DC | PRN

## 2019-09-22 MED ORDER — ONDANSETRON HCL 4 MG/2ML IJ SOLN: 4.0000 mg | Freq: Three times a day (TID) | INTRAMUSCULAR | Status: DC | PRN

## 2019-09-22 MED ORDER — DIPHENHYDRAMINE HCL 25 MG PO CAPS: 25.0000 mg | ORAL_CAPSULE | ORAL | Status: DC | PRN

## 2019-09-22 MED ORDER — LACTATED RINGERS IV SOLN: INTRAVENOUS | Status: DC

## 2019-09-22 MED ORDER — DIPHENHYDRAMINE HCL 25 MG PO CAPS
ORAL_CAPSULE | ORAL | Status: AC
  Filled 2019-09-22: qty 1

## 2019-09-22 MED ORDER — NALBUPHINE HCL 10 MG/ML IJ SOLN: 5.0000 mg | Freq: Once | INTRAMUSCULAR | Status: DC | PRN

## 2019-09-22 MED ORDER — HYDROMORPHONE HCL 1 MG/ML IJ SOLN
0.2500 mg | INTRAMUSCULAR | Status: DC | PRN
  Administered 2019-09-22: 0.5 mg via INTRAVENOUS

## 2019-09-22 MED ORDER — DIBUCAINE (PERIANAL) 1 % EX OINT: 1.0000 "application " | TOPICAL_OINTMENT | CUTANEOUS | Status: DC | PRN

## 2019-09-22 MED ORDER — KETOROLAC TROMETHAMINE 30 MG/ML IJ SOLN
INTRAMUSCULAR | Status: AC
  Filled 2019-09-22: qty 1

## 2019-09-22 MED ORDER — PHENYLEPHRINE HCL-NACL 20-0.9 MG/250ML-% IV SOLN
INTRAVENOUS | Status: DC | PRN
  Administered 2019-09-22: 80 ug/min via INTRAVENOUS

## 2019-09-22 MED ORDER — DEXAMETHASONE SODIUM PHOSPHATE 4 MG/ML IJ SOLN
INTRAMUSCULAR | Status: DC | PRN
  Administered 2019-09-22: 10 mg via INTRAVENOUS

## 2019-09-22 MED ORDER — PHENYLEPHRINE 40 MCG/ML (10ML) SYRINGE FOR IV PUSH (FOR BLOOD PRESSURE SUPPORT)
PREFILLED_SYRINGE | INTRAVENOUS | Status: DC | PRN
  Administered 2019-09-22 (×3): 100 ug via INTRAVENOUS

## 2019-09-22 MED ORDER — ACETAMINOPHEN 10 MG/ML IV SOLN
1000.0000 mg | Freq: Once | INTRAVENOUS | Status: DC | PRN
  Administered 2019-09-22: 1000 mg via INTRAVENOUS

## 2019-09-22 MED ORDER — KETOROLAC TROMETHAMINE 30 MG/ML IJ SOLN
30.0000 mg | Freq: Four times a day (QID) | INTRAMUSCULAR | Status: AC
  Administered 2019-09-23 (×2): 30 mg via INTRAVENOUS
  Filled 2019-09-22 (×2): qty 1

## 2019-09-22 MED ORDER — ONDANSETRON HCL 4 MG/2ML IJ SOLN
INTRAMUSCULAR | Status: DC | PRN
  Administered 2019-09-22: 4 mg via INTRAVENOUS

## 2019-09-22 MED ORDER — HYDROMORPHONE HCL 1 MG/ML IJ SOLN
INTRAMUSCULAR | Status: AC
  Filled 2019-09-22: qty 0.5

## 2019-09-22 MED ORDER — MORPHINE SULFATE (PF) 10 MG/ML IV SOLN
INTRAVENOUS | Status: DC | PRN
  Administered 2019-09-22: 2 mg via EPIDURAL

## 2019-09-22 MED ORDER — PROMETHAZINE HCL 25 MG/ML IJ SOLN: 6.2500 mg | INTRAMUSCULAR | Status: DC | PRN

## 2019-09-22 MED ORDER — DIPHENHYDRAMINE HCL 50 MG/ML IJ SOLN
12.5000 mg | Freq: Four times a day (QID) | INTRAMUSCULAR | Status: DC | PRN
  Administered 2019-09-23: 12.5 mg via INTRAVENOUS
  Filled 2019-09-22: qty 1

## 2019-09-22 MED ORDER — ACETAMINOPHEN 325 MG PO TABS: 650.0000 mg | ORAL_TABLET | Freq: Once | ORAL | Status: DC

## 2019-09-22 MED ORDER — DIPHENHYDRAMINE HCL 25 MG PO CAPS: 25.0000 mg | ORAL_CAPSULE | Freq: Four times a day (QID) | ORAL | Status: DC | PRN

## 2019-09-22 MED ORDER — NALOXONE HCL 4 MG/10ML IJ SOLN
1.0000 ug/kg/h | INTRAVENOUS | Status: DC | PRN
  Filled 2019-09-22: qty 5

## 2019-09-22 MED ORDER — ACETAMINOPHEN 10 MG/ML IV SOLN
INTRAVENOUS | Status: AC
  Filled 2019-09-22: qty 100

## 2019-09-22 MED ORDER — SENNOSIDES-DOCUSATE SODIUM 8.6-50 MG PO TABS
2.0000 | ORAL_TABLET | ORAL | Status: DC
  Administered 2019-09-23 – 2019-09-24 (×3): 2 via ORAL
  Filled 2019-09-22 (×3): qty 2

## 2019-09-22 MED ORDER — CEFAZOLIN SODIUM-DEXTROSE 2-4 GM/100ML-% IV SOLN: 2.0000 g | INTRAVENOUS | Status: DC

## 2019-09-22 SURGICAL SUPPLY — 36 items
BENZOIN TINCTURE PRP APPL 2/3 (GAUZE/BANDAGES/DRESSINGS) ×2 IMPLANT
CHLORAPREP W/TINT 26ML (MISCELLANEOUS) ×2 IMPLANT
CLAMP CORD UMBIL (MISCELLANEOUS) IMPLANT
CLOTH BEACON ORANGE TIMEOUT ST (SAFETY) ×2 IMPLANT
DRSG OPSITE POSTOP 4X10 (GAUZE/BANDAGES/DRESSINGS) ×2 IMPLANT
ELECT REM PT RETURN 9FT ADLT (ELECTROSURGICAL) ×2
ELECTRODE REM PT RTRN 9FT ADLT (ELECTROSURGICAL) ×1 IMPLANT
EXTRACTOR VACUUM M CUP 4 TUBE (SUCTIONS) IMPLANT
GLOVE BIO SURGEON STRL SZ 6.5 (GLOVE) ×2 IMPLANT
GLOVE BIOGEL PI IND STRL 7.0 (GLOVE) ×1 IMPLANT
GLOVE BIOGEL PI INDICATOR 7.0 (GLOVE) ×1
GOWN STRL REUS W/TWL LRG LVL3 (GOWN DISPOSABLE) ×4 IMPLANT
KIT ABG SYR 3ML LUER SLIP (SYRINGE) IMPLANT
NEEDLE HYPO 25X5/8 SAFETYGLIDE (NEEDLE) IMPLANT
NS IRRIG 1000ML POUR BTL (IV SOLUTION) ×2 IMPLANT
PACK C SECTION WH (CUSTOM PROCEDURE TRAY) ×2 IMPLANT
PAD OB MATERNITY 4.3X12.25 (PERSONAL CARE ITEMS) ×2 IMPLANT
PENCIL SMOKE EVAC W/HOLSTER (ELECTROSURGICAL) ×2 IMPLANT
RTRCTR C-SECT PINK 25CM LRG (MISCELLANEOUS) ×2 IMPLANT
STRIP CLOSURE SKIN 1/2X4 (GAUZE/BANDAGES/DRESSINGS) ×2 IMPLANT
STRIP SURGICAL 1/2 X 6 IN (GAUZE/BANDAGES/DRESSINGS) ×2 IMPLANT
SUT MNCRL 0 VIOLET CTX 36 (SUTURE) ×2 IMPLANT
SUT MONOCRYL 0 CTX 36 (SUTURE) ×4
SUT PLAIN 1 NONE 54 (SUTURE) IMPLANT
SUT PLAIN 2 0 (SUTURE) ×2
SUT PLAIN 2 0 XLH (SUTURE) ×2 IMPLANT
SUT PLAIN ABS 2-0 CT1 27XMFL (SUTURE) ×1 IMPLANT
SUT VIC AB 0 CT1 27 (SUTURE) ×4
SUT VIC AB 0 CT1 27XBRD ANBCTR (SUTURE) ×2 IMPLANT
SUT VIC AB 2-0 CT1 27 (SUTURE) ×2
SUT VIC AB 2-0 CT1 TAPERPNT 27 (SUTURE) ×1 IMPLANT
SUT VIC AB 4-0 KS 27 (SUTURE) ×2 IMPLANT
SYR BULB IRRIGATION 50ML (SYRINGE) ×2 IMPLANT
TOWEL OR 17X24 6PK STRL BLUE (TOWEL DISPOSABLE) ×2 IMPLANT
TRAY FOLEY W/BAG SLVR 14FR LF (SET/KITS/TRAYS/PACK) ×2 IMPLANT
WATER STERILE IRR 1000ML POUR (IV SOLUTION) ×2 IMPLANT

## 2019-09-22 NOTE — Progress Notes (Signed)
Patient ID: Sydney Nichols, female   DOB: July 23, 1990, 29 y.o.   MRN: [id number]  Pt comfortable w epidural  AFVSS (low BP since epidural) gen NAD FHTs 140's, mod var, + accels, category 1 toco q , not adequate mVU on 22 of pitocin  D/W pt unchanged cervix x 8 hours despite augmentation D/W pt r/b/a of cesarean section and process.  As there are multiple scheduled and add on cases today, will likely not proceed until 3-4 pm.  Made pt aware.    Will turn off augmentation and allow her to rest.  Will proceed when OR available or if changes in status.

## 2019-09-22 NOTE — Brief Op Note (Signed)
09/22/2019  5:25 PM  PATIENT:  Sydney Nichols  29 y.o. female  PRE-OPERATIVE DIAGNOSIS:  C/S-arrest of dilatation  POST-OPERATIVE DIAGNOSIS:  C/S-arrest of dilatation  PROCEDURE:  Procedure(s): CESAREAN SECTION (N/A) w Vaccuum assistance  SURGEON:  Surgeon(s) and Role:    * Bovard-Stuckert, Esgar Barnick, MD - Primary    * Fair, Hoyle Sauer, MD - Assisting  FINDINGS: viable female infant at 16:33, apgars 8/9.  Wt P; nl uterus, tubes and ovaries.    ANESTHESIA:   epidural  EBL:  1047cc  IVF and uop per anesthesia, clear urine at end of procedure   BLOOD ADMINISTERED:none  DRAINS: Urinary Catheter (Foley)   LOCAL MEDICATIONS USED:  NONE  SPECIMEN:  Source of Specimen:  Placenta to L&D  DISPOSITION OF SPECIMEN:  L&D  COUNTS:  YES  TOURNIQUET:  * No tourniquets in log *  DICTATION: .Other Dictation: Dictation Number [id number]  PLAN OF CARE: Admit to inpatient   PATIENT DISPOSITION:  PACU - hemodynamically stable.   Delay start of Pharmacological VTE agent (>24hrs) due to surgical blood loss or risk of bleeding: not applicable

## 2019-09-22 NOTE — Transfer of Care (Signed)
Immediate Anesthesia Transfer of Care Note  Patient: Sydney Nichols  Procedure(s) Performed: CESAREAN SECTION (N/A )  Patient Location: PACU  Anesthesia Type:Epidural  Level of Consciousness: awake, alert , oriented and patient cooperative  Airway & Oxygen Therapy: Patient Spontanous Breathing  Post-op Assessment: Report given to RN and Post -op Vital signs reviewed and stable  Post vital signs: Reviewed and stable  Last Vitals:  Vitals Value Taken Time  BP 107/71 09/22/19 1731  Temp 36.6 C 09/22/19 1722  Pulse 98 09/22/19 1734  Resp 18 09/22/19 1734  SpO2 94 % 09/22/19 1734  Vitals shown include unvalidated device data.  Last Pain:  Vitals:   09/22/19 1722  TempSrc: Oral  PainSc: 0-No pain         Complications: No apparent anesthesia complications

## 2019-09-22 NOTE — Anesthesia Postprocedure Evaluation (Signed)
Anesthesia Post Note  Patient: Sydney Nichols  Procedure(s) Performed: CESAREAN SECTION (N/A )     Patient location during evaluation: PACU Anesthesia Type: Epidural Level of consciousness: awake and alert Pain management: pain level controlled Vital Signs Assessment: post-procedure vital signs reviewed and stable Respiratory status: spontaneous breathing, nonlabored ventilation and respiratory function stable Cardiovascular status: stable Postop Assessment: no headache, no backache and epidural receding Anesthetic complications: no    Last Vitals:  Vitals:   09/22/19 1900 09/22/19 1915  BP: (!) 89/77 100/60  Pulse: 89 91  Resp: 16 17  Temp:    SpO2: 97% 96%    Last Pain:  Vitals:   09/22/19 1915  TempSrc:   PainSc: 0-No pain   Pain Goal:    LLE Motor Response: Purposeful movement (09/22/19 1915) LLE Sensation: Full sensation (09/22/19 1915) RLE Motor Response: Purposeful movement (09/22/19 1915) RLE Sensation: Full sensation (09/22/19 1915)     Epidural/Spinal Function Cutaneous sensation: Normal sensation (09/22/19 1915), Patient able to flex knees: Yes (09/22/19 1915), Patient able to lift hips off bed: Yes (09/22/19 1915), Back pain beyond tenderness at insertion site: No (09/22/19 1915), Progressively worsening motor and/or sensory loss: No (09/22/19 1915), Bowel and/or bladder incontinence post epidural: No (09/22/19 1915)  Lankford Gutzmer P Layne Lebon

## 2019-09-22 NOTE — Progress Notes (Signed)
Patient ID: Sydney Nichols, female   DOB: [birth year], 29 y.o.   MRN: [id number]   Comfortable w epidural  AFVSS gen NAD FHTs 120-150's mod var, +accels, category 1 toco q 2-4 min  SVE 4.5/70/-1/-2  AROM for clear fluid Will augment w pitocin  Pt somewhat uncomfortable w SVE/AROM

## 2019-09-22 NOTE — Progress Notes (Signed)
Patient ID: Sydney Nichols, female   DOB: 12-20-1990, 29 y.o.   MRN: [id number]   Pt comfortable w epidural  AFVSS gen NAD FHTs 130's, mod var, + accels, category 1 toco q 2-35min  SVE 4,5/70/-1,-2 - unchanged since ROM at 2am  IUPC placed  Briefly mentioned to pt that may be heading toward csection  Continue IOl

## 2019-09-22 NOTE — Progress Notes (Signed)
Patient ID: Sydney Nichols, female   DOB: 1990-08-07, 29 y.o.   MRN: [id number]  LATE ENTRY  Pt's pitocin has been off, section delayed due to business of OR FHTs 135-150, mod var, + accels, category 1 Ctx q  Reviewed r/b/a of LTCS with pt and FOB, no questions.

## 2019-09-23 LAB — BPAM RBC
Blood Product Expiration Date: 202104112359
Blood Product Expiration Date: 202104132359
ISSUE DATE / TIME: 202103131857
ISSUE DATE / TIME: 202103131857
Unit Type and Rh: 5100
Unit Type and Rh: 5100

## 2019-09-23 LAB — TYPE AND SCREEN
ABO/RH(D): O POS
Antibody Screen: NEGATIVE
Unit division: 0
Unit division: 0

## 2019-09-23 LAB — CBC
HCT: 29.1 % — ABNORMAL LOW (ref 36.0–46.0)
Hemoglobin: 9.3 g/dL — ABNORMAL LOW (ref 12.0–15.0)
MCH: 24.4 pg — ABNORMAL LOW (ref 26.0–34.0)
MCHC: 32 g/dL (ref 30.0–36.0)
MCV: 76.4 fL — ABNORMAL LOW (ref 80.0–100.0)
Platelets: 357 10*3/uL (ref 150–400)
RBC: 3.81 MIL/uL — ABNORMAL LOW (ref 3.87–5.11)
RDW: 18.3 % — ABNORMAL HIGH (ref 11.5–15.5)
WBC: 25.3 10*3/uL — ABNORMAL HIGH (ref 4.0–10.5)
nRBC: 0 % (ref 0.0–0.2)

## 2019-09-23 NOTE — Lactation Note (Signed)
This note was copied from a baby's chart. Lactation Consultation Note Baby 14 hrs old. Has been spitting up mucous. Has no interest in BF since birth. LC put baby to the breast in football position.  LC reverse pressure at base of nipple to soften, LC could t-cup areola to place nipple in baby's mouth. LC held it in there and he finally suckled a few times. W/o being held in the mouth, baby wouldn't be able to latch. Newborn behavior, STS, I&O, feeding habits, positioning, breast massage, supply and demand. Mom had PPH received 2 units of blood during the night.  Mom is very pale. Mom shown how to use DEBP & how to disassemble, clean, & reassemble parts. Mom knows to pump q3h for 15-20 min. Mom pumped collecting 2 ml colostrum.  milk storage discussed. Mom has very short shaft/semi flat nipples. Mom stated she done a lot of STS during the night to regulate baby's temps. Praised mom. Encouraged to rest while baby's resting.  If baby hasn't cued in 3 hrs wake baby and stimulate Encouraged mom to call for assistance w/latching or questions. Lactation brochure given.  Patient Name: Sydney Nichols [id number] Date: 09/23/2019 Reason for consult: Initial assessment;Primapara;Early term 37-38.6wks;Maternal endocrine disorder Type of Endocrine Disorder?: PCOS   Maternal Data Has patient been taught Hand Expression?: Yes Does the patient have breastfeeding experience prior to this delivery?: No  Feeding Feeding Type: Breast Fed  LATCH Score Latch: Too sleepy or reluctant, no latch achieved, no sucking elicited.  Audible Swallowing: None  Type of Nipple: Everted at rest and after stimulation(semi flat/edema)  Comfort (Breast/Nipple): Filling, red/small blisters or bruises, mild/mod discomfort  Hold (Positioning): Full assist, staff holds infant at breast  LATCH Score: 3  Interventions Interventions: Breast feeding basics reviewed;Support pillows;Assisted with latch;Position  options;Skin to skin;Expressed milk;Breast massage;Hand express;Shells;Pre-pump if needed;Reverse pressure;DEBP;Hand pump;Breast compression;Adjust position  Lactation Tools Discussed/Used Tools: Shells;Pump Shell Type: Inverted Breast pump type: Double-Electric Breast Pump Pump Review: Setup, frequency, and cleaning;Milk Storage Initiated by:: Peri Jefferson RN IBCLC Date initiated:: 09/23/19   Consult Status Consult Status: Follow-up Date: 09/23/19 Follow-up type: In-patient    Charyl Dancer 09/23/2019, 7:23 AM

## 2019-09-23 NOTE — Lactation Note (Signed)
This note was copied from a baby's chart. Lactation Consultation Note  Patient Name: Boy Aubryana Vittorio OZYYQ'M Date: 09/23/2019 Reason for consult: Follow-up assessment;Difficult latch;Primapara Baby is 22 hours old/1% weight loss.  Baby is not latching but has been syringe fed colostrum a few times.  Mom recently pumped 5 mls and I syringe fed baby.  He has a good suck on a gloved finger.  Baby placed skin to skin in football hold.  Mom pre pumped breast.  Nipple erect but short.  Baby sleepy at breast and showing little interest.  20 mm nipple shield applied. Baby latched and sucked off and on for 5 minutes.  Instructed mom to pump every 2 1/2 hours.  Discussed possible need for formula or donor milk supplementation if no improvement in feedings in the next few hours.  Parents understand and agreeable.  Report given to RN.  Maternal Data    Feeding Feeding Type: Breast Fed  LATCH Score Latch: Repeated attempts needed to sustain latch, nipple held in mouth throughout feeding, stimulation needed to elicit sucking reflex.  Audible Swallowing: None  Type of Nipple: Everted at rest and after stimulation  Comfort (Breast/Nipple): Soft / non-tender  Hold (Positioning): Assistance needed to correctly position infant at breast and maintain latch.  LATCH Score: 6  Interventions Interventions: Hand pump;DEBP;Pre-pump if needed;Breast compression;Adjust position;Assisted with latch;Skin to skin;Support pillows;Breast massage  Lactation Tools Discussed/Used Tools: Nipple Shields Nipple shield size: 20   Consult Status Consult Status: Follow-up Date: 09/24/19 Follow-up type: In-patient    Huston Foley 09/23/2019, 3:06 PM

## 2019-09-23 NOTE — Op Note (Signed)
NAME: Sydney Nichols, Sydney Nichols MEDICAL RECORD [id number] ACCOUNT [id number] DATE OF BIRTH:1991/01/09 FACILITY: [facility] LOCATION: [facility] PHYSICIAN:Kamica Florance BOVARD-STUCKERT, MD  OPERATIVE REPORT  DATE OF PROCEDURE:  09/22/2019  PREOPERATIVE DIAGNOSES:  Intrauterine pregnancy at term, pregnancy-induced hypertension, decreased fetal movement, arrest of dilatation.  POSTOPERATIVE DIAGNOSES:  Intrauterine pregnancy at term, pregnancy-induced hypertension, decreased fetal movement, arrest of dilatation, delivered.  PROCEDURE:  Low transverse cesarean section with vacuum assistance.  SURGEON:  Sherian Rein, MD  ASSISTANT:  Jerilynn Birkenhead, MD  FINDINGS:  Viable female infant at 71 with Apgars of 8 at one minute, 9 at five minutes and a weight pending at the time of dictation.  Normal uterus, tubes and ovaries are noted.  ANESTHESIA:  Epidural.  ESTIMATED BLOOD LOSS:  Approximately 1047 mL.  INTRAVENOUS FLUIDS AND URINE OUTPUT:  Per anesthesia.  Clear urine at the end of the procedure.  PATHOLOGY:  None.  DESCRIPTION OF PROCEDURE:  After informed consent was reviewed with the patient and her partner, she was transported to the operating room where her epidural was dosed and found to be at an appropriate level for a cesarean section.  A brief timeout was  performed after the patient was prepped and draped in the normal sterile fashion.  A Pfannenstiel skin incision was made at the level approximately 2 fingerbreadths above the pubic symphysis and carried through the underlying layer of fascia sharply.   The fascia was incised in the midline.  The incision was extended laterally with Mayo scissors.  Superior aspect of the fascial incision was grasped with Kocher clamps, elevated and the rectus muscles were dissected off both bluntly and sharply.  The  superior aspect of the fascial incision was grasped with Kocher clamps, elevated and the rectus muscles were dissected off both bluntly and sharply.   Midline was easily identified and the peritoneum was entered bluntly.  This incision was extended  superiorly and inferiorly with good visualization of the bladder.  Alexis skin retractor was placed carefully making sure that no bowel was entrapped.  A bladder flap was created both digitally and sharply.  Uterus was incised in a transverse fashion.   Infant was delivered from a vertex presentation with the aid of a vacuum.  Placenta was expressed.  Uterus was cleared of all clot and debris.  Uterine incision was closed in 2 layers, first of which is a running locked and the second as an imbricating  layer.  This was noted to be hemostatic.  The tubes and ovaries were inspected and found to be normal.  The Alexis retractor was removed.  The peritoneum was reapproximated with 2-0 Vicryl in a running fashion.  The subfascial planes were inspected and  found to be hemostatic.  The fascia was reapproximated with 0 Vicryl in a running fashion.  The subcuticular adipose layer was made hemostatic with Bovie cautery.  The dead space was closed with plain gut.  The skin was reapproximated with 4-0 Vicryl on  a Keith needle in a subcuticular fashion.  Benzoin and Steri-Strips were applied.  The patient tolerated the procedure well.  Sponge, lap, and needle count was correct x2 per the operating staff.  JN/NUANCE  D:09/22/2019 T:09/23/2019 JOB:[id number]

## 2019-09-23 NOTE — Progress Notes (Signed)
Subjective: Postpartum Day 1: Cesarean Delivery Patient reports incisional pain and tolerating PO.  Nl lochia, pain controlled Pt with symptoms and 1000 cc blood loss, preoperative anemia - received 2 units pRBCs - feeling better this AM  Objective: Vital signs in last 24 hours: Temp:  [97.4 F (36.3 C)-98.7 F (37.1 C)] 98 F (36.7 C) (03/14 0845) Pulse Rate:  [72-126] 81 (03/14 0845) Resp:  [13-21] 16 (03/14 0450) BP: (77-121)/(26-80) 97/55 (03/14 0845) SpO2:  [94 %-99 %] 98 % (03/14 0845)  Physical Exam:  General: alert and no distress Lochia: appropriate Uterine Fundus: firm Incision: healing well DVT Evaluation: No evidence of DVT seen on physical exam.  Recent Labs    09/22/19 1746 09/23/19 0506  HGB 6.9* 9.3*  HCT 22.2* 29.1*    Assessment/Plan: Status post Cesarean section. Doing well postoperatively.  Continue current care.  Sydney Nichols 09/23/2019, 8:59 AM

## 2019-09-24 ENCOUNTER — Encounter (HOSPITAL_COMMUNITY): Payer: Self-pay | Admitting: Obstetrics and Gynecology

## 2019-09-24 MED ORDER — FERROUS SULFATE 325 (65 FE) MG PO TABS
325.0000 mg | ORAL_TABLET | Freq: Two times a day (BID) | ORAL | Status: DC
  Administered 2019-09-24 – 2019-09-25 (×2): 325 mg via ORAL
  Filled 2019-09-24 (×2): qty 1

## 2019-09-24 MED ORDER — DOCUSATE SODIUM 100 MG PO CAPS
100.0000 mg | ORAL_CAPSULE | Freq: Two times a day (BID) | ORAL | Status: DC
  Administered 2019-09-24 – 2019-09-25 (×3): 100 mg via ORAL
  Filled 2019-09-24 (×3): qty 1

## 2019-09-24 NOTE — Lactation Note (Signed)
This note was copied from a baby's chart. Lactation Consultation Note  Patient Name: Sydney Nichols NFAOZ'[id number] Date: 09/24/2019 Reason for consult: Follow-up assessment;Mother's request;Difficult latch;Early term 37-38.6wks;Infant weight loss Type of Endocrine Disorder?: PCOS P1, 55 hour female infant. -6% weight loss. Mom's hx: C/S delivery, PCOS and HTN Tools given: mom was given 20 mm NS, shell and been using DEBP previously by [facility] due to mom having flat nipples. Mom has mostly been giving infant formula has not been latching infant at breast due infant being sleepy in past 24hours, mom having flat nipples.  Mom wants to breastfeed infant and supplement with formula. LC reviewed hand expression with mom and infant was given 12ml of EBM by spoon.  When [facility] entered the room dad was doing STS with infant. Per dad, infant been consuming 60 mls of formula, not latched at breast in past 24 hours and mom is using DEBP every 4 hours and starting see small amount of colostrum with pumping now, mom in bathroom at this time while dad is talking to [facility]. LC assisted mom in bed, LC ask mom to do breast stimulation, prior to applying 20 mm NS, NS was pre-filled with 0.5 mls of formula using a curve tip syringe, mom latched infant on left breast using the football hold position. Mom brought infant to breast, chin first with nose and chin touching , infant sustained latch, swallows heard, infant was still breastfeeding after 12 minutes and infant continue to breastfeed without being supplement at the breast. Per mom, she only feels tug no pain with latch and she is happy infant is now breastfeeding. Mom's plan: 1. Mom will pre-pump or do breast stimulation prior to latching infant at breast by  applying 20 mm NS. 2. Mom will breastfeed infant according to hunger cues, 8 to 12 times within 24 hours and not exceed 3 hours without breastfeeding infant. 3. Dad plans to supplement infant with 30 mls of formula  with slow  flow bottle nipple after mom finishes breastfeeding infant. 4. Mom will use DEBP and hand express after pumping to help with establishing her milk supply and give infant any EBM.   5. Mom knows to call RN or LC for latch assistance if needed.   Maternal Data    Feeding Feeding Type: Breast Fed Nipple Type: Extra Slow Flow  LATCH Score Latch: Grasps breast easily, tongue down, lips flanged, rhythmical sucking.  Audible Swallowing: Spontaneous and intermittent  Type of Nipple: Flat  Comfort (Breast/Nipple): Soft / non-tender  Hold (Positioning): Assistance needed to correctly position infant at breast and maintain latch.  LATCH Score: 8  Interventions Interventions: Assisted with latch;Adjust position;Breast compression;Support pillows;Skin to skin;DEBP;Position options;Breast massage;Expressed milk;Hand express  Lactation Tools Discussed/Used Tools: Nipple Shields Nipple shield size: 20(flat nipples) Breast pump type: Double-Electric Breast Pump   Consult Status Consult Status: Follow-up Date: 09/25/19 Follow-up type: In-patient    Danelle Earthly 09/24/2019, 11:47 PM

## 2019-09-24 NOTE — Lactation Note (Signed)
This note was copied from a baby's chart. Lactation Consultation Note Baby 37 hrs old. Mom is giving baby formula w/purple nipple. Stating he is doing much better. Mom Is pumping some. Last pumped at 0200 collected 62ml colostrum. Mom stated she was fixing to pump. Milk storage reviewed. Mom doing a lot of STS. Encouraged to BF first then give BM then formula. Encouraged mom to call for assistance or questions.  Patient Name: Sydney Nichols [id number] Date: 09/24/2019     Maternal Data    Feeding    LATCH Score                   Interventions    Lactation Tools Discussed/Used     Consult Status      Charyl Dancer 09/24/2019, 5:38 AM

## 2019-09-24 NOTE — Progress Notes (Signed)
Subjective: Postpartum Day 2: Cesarean Delivery Patient reports incisional pain and tolerating PO.  Nl lochia, pain controlled but slightly increased from POD#1, explained this is normal after anesthesia meds wear off. Feels improved s/p transfusion, ambulating around room without SOB, palpitations or dizziness. Does endorse fatigue. Office circ, needs to touch base with lactations  Objective: Vital signs in last 24 hours: Temp:  [98 F (36.7 C)-98.3 F (36.8 C)] 98.2 F (36.8 C) (03/15 0508) Pulse Rate:  [70-91] 70 (03/15 0508) Resp:  [16-18] 16 (03/15 0508) BP: (97-108)/(55-70) 108/68 (03/15 0508) SpO2:  [98 %-100 %] 100 % (03/15 0508)  Physical Exam:  General: alert and no distress. Appears fatigued but other3wise pleasant Lochia: appropriate Uterine Fundus: firm Incision: healing well, minimal drainage DVT Evaluation: No evidence of DVT seen on physical exam.  Recent Labs    09/22/19 1746 09/23/19 0506  HGB 6.9* 9.3*  HCT 22.2* 29.1*    Assessment/Plan: This is a 29yo G1P1001 s/p PTLCS for arrest of dilation. PNC c/b anemia, GHTN, IVF pregnancy. Postop course c/b acute anemia 2/2 EBL.  1) Postop anemia: improving; exacerbation of chronic anemia from antepartum -S/p 2u pRBC, H/H improved from 6.9/22.2 > 9.3/29.1 -Fe/Colace BID ordered -VSS  2) Postop milestones -Tolerating PO, voiding, passing flatus -Encourage ambulation, incentive spirometry  3) GHT: BP normotensive, asx from PreE standpoint -Will need 1wk BP check  Anticipate DC POD#3  Valerie Roys Dorcus Riga 09/24/2019, 8:33 AM

## 2019-09-25 MED ORDER — IBUPROFEN 600 MG PO TABS: 600.0000 mg | ORAL_TABLET | Freq: Four times a day (QID) | ORAL | 1 refills | Status: DC | PRN

## 2019-09-25 MED ORDER — OXYCODONE HCL 5 MG PO TABS: 5.0000 mg | ORAL_TABLET | ORAL | 0 refills | Status: AC | PRN

## 2019-09-25 NOTE — Progress Notes (Signed)
Subjective: Postpartum Day 3: Cesarean Delivery Patient reports tolerating PO, + flatus and no problems voiding.  Had BM yesterday. She denies HA, blurry vision, CP or SOB. She reports lochia- mild. Pain well controlled. Bonding well with baby. Ready for discharge to home today; baby's weight being watched  Objective: Vital signs in last 24 hours: Temp:  [97.9 F (36.6 C)-98.4 F (36.9 C)] 97.9 F (36.6 C) (03/16 0511) Pulse Rate:  [71-84] 72 (03/16 0511) Resp:  [18] 18 (03/16 0511) BP: (114-125)/(71-77) 116/77 (03/16 0511) SpO2:  [99 %-100 %] 100 % (03/16 0511)  Physical Exam:  General: alert, cooperative and no distress Lochia: appropriate Uterine Fundus: firm Incision: no significant drainage DVT Evaluation: No evidence of DVT seen on physical exam. Calf/Ankle edema is present.  Recent Labs    09/22/19 1746 09/23/19 0506  HGB 6.9* 9.3*  HCT 22.2* 29.1*    Assessment/Plan: Status post Cesarean section. Doing well postoperatively.  Discharge home with standard precautions and return to clinic in 1 week for BP check, 2 weeks for incision check; schedule circumcision for baby Discussed nesting if baby has to stay due to weight   Janean Sark Heraclio Seidman 09/25/2019, 9:05 AM

## 2019-09-25 NOTE — Lactation Note (Signed)
This note was copied from a baby's chart. Lactation Consultation Note  Patient Name: Sydney Nichols [id number] Date: 09/25/2019 Reason for consult: Follow-up assessment;Primapara;1st time breastfeeding;Early term 37-38.6wks;Infant weight loss;Other (Comment);Hyperbilirubinemia(9 % weight loss/ photo started around 12 N) Type of Endocrine Disorder?: PCOS(mom reports + breast changes)   LC student entered room to conduct follow up visit. MOB holding baby feeding with a bottle of formula. Father of Sydney Nichols says they are "being told we have to make him eat even if he is not hungry" and continued to feed baby while we discussed feeding cues. FOB stopped feeding after discussion of feeding cues. Parents shared concerns about billirubin levels and explained that they want to go home and that's why they felt they needed to feed.   [facility] student reviewed feeding plan noted by [facility] Robin with parents.   MOB shared that they have a Medela Pump N Style at home.  LC student left room to allow family to process bilirubin results together.               Interventions Interventions: Breast feeding basics reviewed  Lactation Tools Discussed/Used Pump Review: Milk Storage   Consult Status Consult Status: Follow-up Date: 09/25/19 Follow-up type: In-patient    [name] 09/25/2019, 1:19 PM

## 2019-09-25 NOTE — Discharge Instructions (Signed)
Call office with any concerns [phone] 

## 2019-09-25 NOTE — Lactation Note (Signed)
This note was copied from a baby's chart. Lactation Consultation Note  Patient Name: Sydney Nichols OZHYQ'M Date: 09/25/2019 Reason for consult: Follow-up assessment;Primapara;1st time breastfeeding;Early term 37-38.6wks;Infant weight loss;Other (Comment);Hyperbilirubinemia(9 % weight loss/ photo started around 12 N) Type of Endocrine Disorder?: PCOS(mom reports + breast changes)  Serum Bilirubin - 14.0 at 65 hours of life  As LC entered the room, baby being placed on photo tx blanket.  Mom appears very tired and is having gas pain in her shoulders.  Per dad baby ate 35 ml at 9am and 5 ml at 11:30 am and didn't seem overly interested in feeding.  LC recommended trying again with feeding cues an by 2 hours , may still be full from the 35 ml. If baby is to sluggish to latch feed from the bottle. Important not to waste time trying to feed. LC stressed the importance of the baby getting consistent calories In the baby. Dad voiced is concerns of not being able to measure the volume the baby is taking and having a desire measure it.  LC plan for now :   Also recommended for now for mom to work on getting gas pain under control so she will be more comfortable breast feeding or pumping.  Mom aware of the importance of pumping to enhance the milk coming in.  Naps when the baby is sleeping  If mom is feeling better next feeding - call LC for latch with Nipple Shield.  If not dad to feed a bottle of at least 30 ml and if mom feeling up to it pump with the DEBP .  Dad mentioned there is colostrum in the refrigerator . LC mentioned to feed it separate from the formula.  LC discussed with mom and dad how jaundice can make the baby sluggish and how important calories are. LC reassured mom and dad.     Maternal Data    Feeding Feeding Type: (last fed at 11:30 5 ml , and prevoius feeding was > 30 ml) Nipple Type: Extra Slow Flow  LATCH Score                    Interventions Interventions: Breast feeding basics reviewed  Lactation Tools Discussed/Used Pump Review: Milk Storage   Consult Status Consult Status: Follow-up Date: 09/25/19 Follow-up type: In-patient    Matilde Sprang Ameet Sandy 09/25/2019, 12:25 PM

## 2019-09-25 NOTE — Discharge Summary (Signed)
OB Discharge Summary     Patient Name: Sydney Nichols DOB: 11-01-1990 MRN: [id number]  Date of admission: 09/21/2019 Delivering MD: Sherian Rein   Date of discharge: 09/25/2019  Admitting diagnosis: Pregnant [Z34.90] Normal postpartum course [Z39.2] Intrauterine pregnancy: [redacted]w[redacted]d     Secondary diagnosis:  Principal Problem:   S/P cesarean section Active Problems:   Pregnant   PIH (pregnancy induced hypertension)   Normal postpartum course  Additional problems: intrapartum hemorrhage     Discharge diagnosis: Term Pregnancy Delivered and Gestational Hypertension                                                                                                Post partum procedures:n/a  Augmentation: AROM, Pitocin, Cytotec and Foley Balloon  Complications: Hemorrhage>1013mL  Hospital course:  Induction of Labor With Cesarean Section  29 y.o. yo G1P1001 at [redacted]w[redacted]d was admitted to the hospital 09/21/2019 for induction of labor. Patient had a labor course significant for arrest of dilation. The patient went for cesarean section due to Arrest of Dilation, and delivered a Viable infant,09/22/2019  Membrane Rupture Time/Date: 2:00 AM ,09/22/2019   Details of operation can be found in separate operative Note.  Patient had an uncomplicated postpartum course. She is ambulating, tolerating a regular diet, passing flatus, and urinating well.  Patient is discharged home in stable condition on 09/25/19.                                    Physical exam  Vitals:   09/24/19 0508 09/24/19 1517 09/24/19 2149 09/25/19 0511  BP: 108/68 114/71 125/72 116/77  Pulse: 70 84 71 72  Resp: 16 18 18 18   Temp: 98.2 F (36.8 C) 98.4 F (36.9 C) 98.4 F (36.9 C) 97.9 F (36.6 C)  TempSrc: Oral Oral Oral Oral  SpO2: 100% 99% 100% 100%  Weight:      Height:       General: alert, cooperative and no distress Lochia: appropriate Uterine Fundus: firm Incision: Dressing is clean, dry, and intact DVT  Evaluation: No evidence of DVT seen on physical exam. Calf/Ankle edema is present Labs: Lab Results  Component Value Date   WBC 25.3 (H) 09/23/2019   HGB 9.3 (L) 09/23/2019   HCT 29.1 (L) 09/23/2019   MCV 76.4 (L) 09/23/2019   PLT 357 09/23/2019   CMP Latest Ref Rng & Units 09/21/2019  Glucose 70 - 99 mg/dL 89  BUN 6 - 20 mg/dL 7  Creatinine 11/21/2019 - 4.62 mg/dL 7.03  Sodium 5.00 - 938 mmol/L 137  Potassium 3.5 - 5.1 mmol/L 4.4  Chloride 98 - 111 mmol/L 104  CO2 22 - 32 mmol/L 23  Calcium 8.9 - 10.3 mg/dL 10.4(H)  Total Protein 6.5 - 8.1 g/dL 6.8  Total Bilirubin 0.3 - 1.2 mg/dL 0.4  Alkaline Phos 38 - 126 U/L 209(H)  AST 15 - 41 U/L 21  ALT 0 - 44 U/L 12    Discharge instruction: per After Visit Summary and "Baby and Me Booklet".  After visit meds:  Allergies as of 09/25/2019   No Known Allergies     Medication List    TAKE these medications   docusate sodium 100 MG capsule Commonly known as: COLACE Take 100 mg by mouth as needed for mild constipation.   ferrous sulfate 325 (65 FE) MG tablet Take 325 mg by mouth 2 (two) times daily with a meal.   ibuprofen 600 MG tablet Commonly known as: ADVIL Take 1 tablet (600 mg total) by mouth every 6 (six) hours as needed for moderate pain or cramping.   multivitamin-prenatal 27-0.8 MG Tabs tablet Take 1 tablet by mouth daily at 12 noon.   ondansetron 4 MG disintegrating tablet Commonly known as: ZOFRAN-ODT Take 4 mg by mouth every 8 (eight) hours as needed for nausea or vomiting.   oxyCODONE 5 MG immediate release tablet Commonly known as: Oxy IR/ROXICODONE Take 1 tablet (5 mg total) by mouth every 4 (four) hours as needed for up to 7 days for severe pain.       Diet: low salt diet  Activity: Advance as tolerated. Pelvic rest for 6 weeks.   Outpatient follow CB:ULAGTX to clinic in 1 week for BP check, 2 weeks for incision check; schedule circumcision for baby Follow up Appt:No future appointments. Follow up  Visit:No follow-ups on file.  Postpartum contraception: Not Discussed  Newborn Data: Live born female  Birth Weight: 6 lb 10 oz (3005 g) APGAR: 54, 9  Newborn Delivery   Birth date/time: 09/22/2019 16:33:00 Delivery type: C-Section, Low Transverse Trial of labor: Yes C-section categorization: Primary      Baby Feeding: Bottle Disposition:home with mother   09/25/2019 Isaiah Serge, DO

## 2019-09-26 ENCOUNTER — Telehealth: Payer: Self-pay

## 2019-09-26 ENCOUNTER — Ambulatory Visit: Payer: Self-pay

## 2019-09-26 NOTE — Telephone Encounter (Signed)
error 

## 2019-09-26 NOTE — Lactation Note (Signed)
This note was copied from a baby's chart. Lactation Consultation Note:  Mother is a P59, infant is 34 hours old and is now at 9% wt loss. Infant has a slight weight gain and bili came down. Staff nurse in room to remove bili lite.    Mother's breast are filling and some areas are firm. Mother taught to do good massage and then Reviewed hand expression with mother. Observed large drops of colostrum. Staff nurse brought mother ice packs for her breast.  Mothers nipples are erect . No observed trama on mothers nipples.   Mother has a DEBP sat up at the bedside. Mother also has Medela Pump N Style at home.   Plan of Care : Breastfeed infant with feeding cues, advised to allow infant to nuzzle at the breast before feeding and with feeding cues.  Supplement infant with ebm/formula, according to supplemental guidelines. Pump using a DEBP after each feeding for 15-20 mins.   Mother to continue to cue base feed infant and feed at least 8-12 times or more in 24 hours and advised to allow for cluster feeding infant as needed.   Mother to continue to due STS. Mother is aware of available LC services at [facility], BFSG'S, OP Dept, and phone # for questions or concerns about breastfeeding.  Mother receptive to all teaching and plan of care.    Patient Name: Boy Syliva Mee JSEGB'T Date: 09/26/2019 Reason for consult: Follow-up assessment   Maternal Data    Feeding Feeding Type: Formula Nipple Type: Slow - flow  LATCH Score                   Interventions    Lactation Tools Discussed/Used     Consult Status      Michel Bickers 09/26/2019, 10:36 AM

## 2019-11-01 DIAGNOSIS — Z1389 Encounter for screening for other disorder: Secondary | ICD-10-CM | POA: Diagnosis not present

## 2019-11-01 DIAGNOSIS — Z3009 Encounter for other general counseling and advice on contraception: Secondary | ICD-10-CM | POA: Diagnosis not present

## 2019-11-19 ENCOUNTER — Other Ambulatory Visit: Payer: Self-pay

## 2019-11-19 ENCOUNTER — Emergency Department (HOSPITAL_COMMUNITY)
Admission: EM | Admit: 2019-11-19 | Discharge: 2019-11-19 | Disposition: A | Discharge status: Home / Self Care | Payer: BC Managed Care – PPO | Attending: Emergency Medicine | Admitting: Emergency Medicine

## 2019-11-19 ENCOUNTER — Encounter (HOSPITAL_COMMUNITY): Payer: Self-pay

## 2019-11-19 DIAGNOSIS — Z885 Allergy status to narcotic agent status: Secondary | ICD-10-CM | POA: Diagnosis not present

## 2019-11-19 DIAGNOSIS — Z5321 Procedure and treatment not carried out due to patient leaving prior to being seen by health care provider: Secondary | ICD-10-CM | POA: Insufficient documentation

## 2019-11-19 DIAGNOSIS — R112 Nausea with vomiting, unspecified: Secondary | ICD-10-CM | POA: Diagnosis not present

## 2019-11-19 DIAGNOSIS — R1013 Epigastric pain: Secondary | ICD-10-CM | POA: Diagnosis not present

## 2019-11-19 DIAGNOSIS — R109 Unspecified abdominal pain: Secondary | ICD-10-CM | POA: Insufficient documentation

## 2019-11-19 DIAGNOSIS — Z20822 Contact with and (suspected) exposure to covid-19: Secondary | ICD-10-CM | POA: Diagnosis not present

## 2019-11-19 DIAGNOSIS — E669 Obesity, unspecified: Secondary | ICD-10-CM | POA: Diagnosis not present

## 2019-11-19 DIAGNOSIS — R1011 Right upper quadrant pain: Secondary | ICD-10-CM | POA: Diagnosis not present

## 2019-11-19 DIAGNOSIS — M549 Dorsalgia, unspecified: Secondary | ICD-10-CM | POA: Diagnosis not present

## 2019-11-19 DIAGNOSIS — Z79899 Other long term (current) drug therapy: Secondary | ICD-10-CM | POA: Diagnosis not present

## 2019-11-19 DIAGNOSIS — K828 Other specified diseases of gallbladder: Secondary | ICD-10-CM | POA: Diagnosis not present

## 2019-11-19 DIAGNOSIS — K811 Chronic cholecystitis: Secondary | ICD-10-CM | POA: Diagnosis not present

## 2019-11-19 DIAGNOSIS — Z6831 Body mass index (BMI) 31.0-31.9, adult: Secondary | ICD-10-CM | POA: Diagnosis not present

## 2019-11-19 LAB — COMPREHENSIVE METABOLIC PANEL
ALT: 275 U/L — ABNORMAL HIGH (ref 0–44)
AST: 279 U/L — ABNORMAL HIGH (ref 15–41)
Albumin: 4.6 g/dL (ref 3.5–5.0)
Alkaline Phosphatase: 273 U/L — ABNORMAL HIGH (ref 38–126)
Anion gap: 12 (ref 5–15)
BUN: 10 mg/dL (ref 6–20)
CO2: 25 mmol/L (ref 22–32)
Calcium: 9.3 mg/dL (ref 8.9–10.3)
Chloride: 103 mmol/L (ref 98–111)
Creatinine, Ser: 0.95 mg/dL (ref 0.44–1.00)
GFR calc Af Amer: >60 mL/min (ref >60)
GFR calc non Af Amer: >60 mL/min (ref >60)
Glucose, Bld: 155 mg/dL — ABNORMAL HIGH (ref 70–99)
Potassium: 3.4 mmol/L — ABNORMAL LOW (ref 3.5–5.1)
Sodium: 140 mmol/L (ref 135–145)
Total Bilirubin: 1.3 mg/dL — ABNORMAL HIGH (ref 0.3–1.2)
Total Protein: 8.3 g/dL — ABNORMAL HIGH (ref 6.5–8.1)

## 2019-11-19 LAB — CBC
HCT: 35.2 % — ABNORMAL LOW (ref 36.0–46.0)
Hemoglobin: 11 g/dL — ABNORMAL LOW (ref 12.0–15.0)
MCH: 23.9 pg — ABNORMAL LOW (ref 26.0–34.0)
MCHC: 31.3 g/dL (ref 30.0–36.0)
MCV: 76.4 fL — ABNORMAL LOW (ref 80.0–100.0)
Platelets: 387 10*3/uL (ref 150–400)
RBC: 4.61 MIL/uL (ref 3.87–5.11)
RDW: 18.6 % — ABNORMAL HIGH (ref 11.5–15.5)
WBC: 10.2 10*3/uL (ref 4.0–10.5)
nRBC: 0 % (ref 0.0–0.2)

## 2019-11-19 LAB — LIPASE, BLOOD: Lipase: 23 U/L (ref 11–51)

## 2019-11-19 LAB — HCG, QUANTITATIVE, PREGNANCY: hCG, Beta Chain, Quant, S: <1 m[IU]/mL (ref <5)

## 2019-11-19 NOTE — ED Notes (Signed)
Pt turned stickers in to registration and eloped from waiting room prior to speaking with nurse. Vs stable.

## 2019-11-19 NOTE — ED Triage Notes (Signed)
Pt presents with severe abdominal pain radiating from umbilicus to upper back. Rates pain 10/10. Sts she had a c- section 8 weeks ago.

## 2019-11-20 DIAGNOSIS — R1013 Epigastric pain: Secondary | ICD-10-CM | POA: Diagnosis not present

## 2019-11-20 DIAGNOSIS — K828 Other specified diseases of gallbladder: Secondary | ICD-10-CM | POA: Diagnosis not present

## 2019-11-20 DIAGNOSIS — K811 Chronic cholecystitis: Secondary | ICD-10-CM | POA: Diagnosis not present

## 2019-11-20 DIAGNOSIS — D360 Benign neoplasm of lymph nodes: Secondary | ICD-10-CM | POA: Diagnosis not present

## 2019-11-20 DIAGNOSIS — K819 Cholecystitis, unspecified: Secondary | ICD-10-CM | POA: Diagnosis not present

## 2019-11-20 DIAGNOSIS — K8050 Calculus of bile duct without cholangitis or cholecystitis without obstruction: Secondary | ICD-10-CM | POA: Diagnosis not present

## 2019-11-20 DIAGNOSIS — R1011 Right upper quadrant pain: Secondary | ICD-10-CM | POA: Diagnosis not present

## 2020-08-15 ENCOUNTER — Ambulatory Visit (INDEPENDENT_AMBULATORY_CARE_PROVIDER_SITE_OTHER): Payer: BC Managed Care – PPO | Admitting: Family Medicine

## 2020-08-15 VITALS — BP 118/70 | HR 75 | Temp 98.0°F | Wt 182.8 lb

## 2020-08-15 DIAGNOSIS — R42 Dizziness and giddiness: Secondary | ICD-10-CM | POA: Diagnosis not present

## 2020-08-15 DIAGNOSIS — R55 Syncope and collapse: Secondary | ICD-10-CM | POA: Diagnosis not present

## 2020-08-15 NOTE — Progress Notes (Signed)
   Subjective:    Patient ID: Sydney Nichols, female    DOB: Dec 05, 1990, 30 y.o.   MRN: [id number]  HPI She states that the other morning she woke up and went into her son's bedroom and while standing there became lightheaded, diaphoretic and then had tunnel vision.  She then laid down and the symptoms slowly went away.  She has kept her self hydrated.  She is on no medications.  Been under no undue stress.  Never had this problem before.  Did not notice any heart rate changes.   Review of Systems     Objective:   Physical Exam Alert and in no distress. Tympanic membranes and canals are normal. Pharyngeal area is normal. Neck is supple without adenopathy or thyromegaly. Cardiac exam shows a regular sinus rhythm without murmurs or gallops. Lungs are clear to auscultation.        Assessment & Plan:  Postural dizziness with presyncope I explained that she had a presyncopal episode with no precipitating cause at the present time.  Discussed possible work-up for this versus watchful waiting.  She is comfortable with watchful waiting which I agree with.

## 2020-08-20 ENCOUNTER — Ambulatory Visit: Payer: Self-pay | Admitting: Family Medicine

## 2020-09-02 DIAGNOSIS — D649 Anemia, unspecified: Secondary | ICD-10-CM | POA: Diagnosis not present

## 2020-09-02 DIAGNOSIS — N970 Female infertility associated with anovulation: Secondary | ICD-10-CM | POA: Diagnosis not present

## 2020-09-02 DIAGNOSIS — Z130 Encounter for screening for diseases of the blood and blood-forming organs and certain disorders involving the immune mechanism: Secondary | ICD-10-CM | POA: Diagnosis not present

## 2020-09-02 DIAGNOSIS — N939 Abnormal uterine and vaginal bleeding, unspecified: Secondary | ICD-10-CM | POA: Diagnosis not present

## 2020-09-02 LAB — IRON,TIBC AND FERRITIN PANEL
%SAT: 2
Ferritin: 3
Iron: 9
TIBC: 505
UIBC: 496

## 2020-09-02 LAB — TSH: TSH: 0.02 — AB (ref <=5.90)

## 2020-09-03 LAB — CBC AND DIFFERENTIAL
HCT: 28 — AB (ref 36–46)
Hemoglobin: 8.2 — AB (ref 12.0–16.0)

## 2020-09-08 DIAGNOSIS — E282 Polycystic ovarian syndrome: Secondary | ICD-10-CM | POA: Diagnosis not present

## 2020-09-08 DIAGNOSIS — N939 Abnormal uterine and vaginal bleeding, unspecified: Secondary | ICD-10-CM | POA: Diagnosis not present

## 2020-09-10 ENCOUNTER — Non-Acute Institutional Stay (HOSPITAL_COMMUNITY)
Admission: RE | Admit: 2020-09-10 | Discharge: 2020-09-10 | Disposition: A | Discharge status: Home / Self Care | Payer: BC Managed Care – PPO | Source: Ambulatory Visit | Attending: Internal Medicine | Admitting: Internal Medicine

## 2020-09-10 ENCOUNTER — Other Ambulatory Visit: Payer: Self-pay

## 2020-09-10 DIAGNOSIS — D509 Iron deficiency anemia, unspecified: Secondary | ICD-10-CM | POA: Insufficient documentation

## 2020-09-10 MED ORDER — SODIUM CHLORIDE 0.9 % IV SOLN
INTRAVENOUS | Status: DC | PRN
  Administered 2020-09-10: 250 mL via INTRAVENOUS

## 2020-09-10 MED ORDER — SODIUM CHLORIDE 0.9 % IV SOLN
510.0000 mg | Freq: Once | INTRAVENOUS | Status: AC
  Administered 2020-09-10: 510 mg via INTRAVENOUS
  Filled 2020-09-10: qty 510

## 2020-09-10 NOTE — Discharge Instructions (Signed)
Ferumoxytol injection What is this medicine? FERUMOXYTOL is an iron complex. Iron is used to make healthy red blood cells, which carry oxygen and nutrients throughout the body. This medicine is used to treat iron deficiency anemia. This medicine may be used for other purposes; ask your health care provider or pharmacist if you have questions. COMMON BRAND NAME(S): Feraheme What should I tell my health care provider before I take this medicine? They need to know if you have any of these conditions:  anemia not caused by low iron levels  high levels of iron in the blood  magnetic resonance imaging (MRI) test scheduled  an unusual or allergic reaction to iron, other medicines, foods, dyes, or preservatives  pregnant or trying to get pregnant  breast-feeding How should I use this medicine? This medicine is for injection into a vein. It is given by a health care professional in a hospital or clinic setting. Talk to your pediatrician regarding the use of this medicine in children. Special care may be needed. Overdosage: If you think you have taken too much of this medicine contact a poison control center or emergency room at once. NOTE: This medicine is only for you. Do not share this medicine with others. What if I miss a dose? It is important not to miss your dose. Call your doctor or health care professional if you are unable to keep an appointment. What may interact with this medicine? This medicine may interact with the following medications:  other iron products This list may not describe all possible interactions. Give your health care provider a list of all the medicines, herbs, non-prescription drugs, or dietary supplements you use. Also tell them if you smoke, drink alcohol, or use illegal drugs. Some items may interact with your medicine. What should I watch for while using this medicine? Visit your doctor or healthcare professional regularly. Tell your doctor or healthcare  professional if your symptoms do not start to get better or if they get worse. You may need blood work done while you are taking this medicine. You may need to follow a special diet. Talk to your doctor. Foods that contain iron include: whole grains/cereals, dried fruits, beans, or peas, leafy green vegetables, and organ meats (liver, kidney). What side effects may I notice from receiving this medicine? Side effects that you should report to your doctor or health care professional as soon as possible:  allergic reactions like skin rash, itching or hives, swelling of the face, lips, or tongue  breathing problems  changes in blood pressure  feeling faint or lightheaded, falls  fever or chills  flushing, sweating, or hot feelings  swelling of the ankles or feet Side effects that usually do not require medical attention (report to your doctor or health care professional if they continue or are bothersome):  diarrhea  headache  nausea, vomiting  stomach pain This list may not describe all possible side effects. Call your doctor for medical advice about side effects. You may report side effects to FDA at 1-800-FDA-1088. Where should I keep my medicine? This drug is given in a hospital or clinic and will not be stored at home. NOTE: This sheet is a summary. It may not cover all possible information. If you have questions about this medicine, talk to your doctor, pharmacist, or health care provider.  2021 Elsevier/Gold Standard (2016-08-16 20:21:10)  

## 2020-09-10 NOTE — Progress Notes (Signed)
Patient received IV Feraheme (dose 1 of 2) as ordered by Sherron Monday  MD. Observed for at least 30 minutes post infusion. Tolerated well, vitals stable, discharge instructions given , verbalized understanding. Patient instructed to schedule second dose at front desk to receive in 1 week, verbalized understanding.  Patient alert, oriented, and ambulatory at the time of discharge.

## 2020-09-16 ENCOUNTER — Non-Acute Institutional Stay (HOSPITAL_COMMUNITY)
Admission: RE | Admit: 2020-09-16 | Discharge: 2020-09-16 | Disposition: A | Discharge status: Home / Self Care | Payer: BC Managed Care – PPO | Source: Ambulatory Visit | Attending: Internal Medicine | Admitting: Internal Medicine

## 2020-09-16 ENCOUNTER — Encounter: Payer: Self-pay | Admitting: Family Medicine

## 2020-09-16 ENCOUNTER — Telehealth (INDEPENDENT_AMBULATORY_CARE_PROVIDER_SITE_OTHER): Payer: BC Managed Care – PPO | Admitting: Family Medicine

## 2020-09-16 ENCOUNTER — Telehealth: Payer: Self-pay

## 2020-09-16 ENCOUNTER — Other Ambulatory Visit: Payer: Self-pay

## 2020-09-16 VITALS — BP 116/60 | Temp 98.4°F | Wt 182.0 lb

## 2020-09-16 DIAGNOSIS — D509 Iron deficiency anemia, unspecified: Secondary | ICD-10-CM

## 2020-09-16 DIAGNOSIS — R7989 Other specified abnormal findings of blood chemistry: Secondary | ICD-10-CM

## 2020-09-16 MED ORDER — SODIUM CHLORIDE 0.9 % IV SOLN
INTRAVENOUS | Status: DC | PRN
  Administered 2020-09-16: 250 mL via INTRAVENOUS

## 2020-09-16 MED ORDER — SODIUM CHLORIDE 0.9 % IV SOLN
510.0000 mg | Freq: Once | INTRAVENOUS | Status: AC
  Administered 2020-09-16: 510 mg via INTRAVENOUS
  Filled 2020-09-16: qty 510

## 2020-09-16 NOTE — Progress Notes (Signed)
Patient received IV Feraheme (dose 2 of 2) as ordered by Sherron Monday MD. Observed for at least 30 minutes post infusion. Tolerated well, vitals stable, discharge instructions given , verbalized understanding. Patient alert, oriented, and ambulatory at the time of discharge.

## 2020-09-16 NOTE — Telephone Encounter (Signed)
Scheduled for a virtual this afternoon.

## 2020-09-16 NOTE — Discharge Instructions (Signed)
Ferumoxytol injection What is this medicine? FERUMOXYTOL is an iron complex. Iron is used to make healthy red blood cells, which carry oxygen and nutrients throughout the body. This medicine is used to treat iron deficiency anemia. This medicine may be used for other purposes; ask your health care provider or pharmacist if you have questions. COMMON BRAND NAME(S): Feraheme What should I tell my health care provider before I take this medicine? They need to know if you have any of these conditions:  anemia not caused by low iron levels  high levels of iron in the blood  magnetic resonance imaging (MRI) test scheduled  an unusual or allergic reaction to iron, other medicines, foods, dyes, or preservatives  pregnant or trying to get pregnant  breast-feeding How should I use this medicine? This medicine is for injection into a vein. It is given by a health care professional in a hospital or clinic setting. Talk to your pediatrician regarding the use of this medicine in children. Special care may be needed. Overdosage: If you think you have taken too much of this medicine contact a poison control center or emergency room at once. NOTE: This medicine is only for you. Do not share this medicine with others. What if I miss a dose? It is important not to miss your dose. Call your doctor or health care professional if you are unable to keep an appointment. What may interact with this medicine? This medicine may interact with the following medications:  other iron products This list may not describe all possible interactions. Give your health care provider a list of all the medicines, herbs, non-prescription drugs, or dietary supplements you use. Also tell them if you smoke, drink alcohol, or use illegal drugs. Some items may interact with your medicine. What should I watch for while using this medicine? Visit your doctor or healthcare professional regularly. Tell your doctor or healthcare  professional if your symptoms do not start to get better or if they get worse. You may need blood work done while you are taking this medicine. You may need to follow a special diet. Talk to your doctor. Foods that contain iron include: whole grains/cereals, dried fruits, beans, or peas, leafy green vegetables, and organ meats (liver, kidney). What side effects may I notice from receiving this medicine? Side effects that you should report to your doctor or health care professional as soon as possible:  allergic reactions like skin rash, itching or hives, swelling of the face, lips, or tongue  breathing problems  changes in blood pressure  feeling faint or lightheaded, falls  fever or chills  flushing, sweating, or hot feelings  swelling of the ankles or feet Side effects that usually do not require medical attention (report to your doctor or health care professional if they continue or are bothersome):  diarrhea  headache  nausea, vomiting  stomach pain This list may not describe all possible side effects. Call your doctor for medical advice about side effects. You may report side effects to FDA at 1-800-FDA-1088. Where should I keep my medicine? This drug is given in a hospital or clinic and will not be stored at home. NOTE: This sheet is a summary. It may not cover all possible information. If you have questions about this medicine, talk to your doctor, pharmacist, or health care provider.  2021 Elsevier/Gold Standard (2016-08-16 20:21:10)  

## 2020-09-16 NOTE — Progress Notes (Signed)
   Subjective:   Documentation for virtual audio and video telecommunications through Caregility encounter:  The patient was located at [facility] infusion center. 2 patient identifiers used.  The provider was located in the office. The patient did consent to this visit and is aware of possible charges through their insurance for this visit.  The other persons participating in this telemedicine service were none. Time spent on call was 12 minutes and in review of previous records 15 minutes total.  This virtual service is not related to other E/M service within previous 7 days.   Patient ID: Sydney Nichols, female    DOB: Nov 28, 1990, 30 y.o.   MRN: [id number]  HPI Chief Complaint  Patient presents with  . cocncern with bleeding issues    I have not seen her since 2019.  She is currently at [facility] getting an iron infusion but wanted to go ahead and conduct the visit.  She has questions regarding recent lab results ordered by her OB/GYN.   States her TSH was low.  States she is being referred to oncology for this.   Dr. Ellyn Hack is her OB/GYN.  She has an upcoming surgery to remove endometrial polyps.   States she is wanting my opinion about her labs.    Review of Systems Pertinent positives and negatives in the history of present illness.     Objective:   Physical Exam BP 116/60   Temp 98.4 F (36.9 C)   Wt 182 lb (82.6 kg)   BMI 31.24 kg/m   Alert and oriented.      Assessment & Plan:  Low TSH level  Iron deficiency anemia, unspecified iron deficiency anemia type  Requesting a second opinion regarding recent low TSH.  Discussed that typically I would repeat this level as well as order more thyroid labs.  Discussed that typically endocrinologists treat hyperthyroidism.  She is currently receiving iron transfusion for anemia.  Answered her questions.  Follow-up as needed.

## 2020-09-16 NOTE — Telephone Encounter (Signed)
She will need a visit to discuss all of this.

## 2020-09-16 NOTE — Telephone Encounter (Signed)
Pt. Called stating she has been having bleeding for 2 months now, she saw her obgyn who did find endometrium polyps, which she is having surgery on in April. She stated her Iron level was 8 and her TSH was 0.01 they had her on iron supplement, prevara and now they are wanting to put her on birth control but she is not sure if that is a great idea or not. She said she has always had issues when on birth control. She wanted to know your thoughts and if she should see you for this issue, or follow there recommendations.

## 2020-09-19 ENCOUNTER — Telehealth: Payer: Self-pay | Admitting: Hematology and Oncology

## 2020-09-19 NOTE — Telephone Encounter (Signed)
Scheduled appts per 3/11 sch msg. Called pt no answer. Left msg with appts date and times.

## 2020-09-24 DIAGNOSIS — D509 Iron deficiency anemia, unspecified: Secondary | ICD-10-CM | POA: Diagnosis not present

## 2020-09-24 DIAGNOSIS — E0590 Thyrotoxicosis, unspecified without thyrotoxic crisis or storm: Secondary | ICD-10-CM | POA: Diagnosis not present

## 2020-09-24 DIAGNOSIS — E282 Polycystic ovarian syndrome: Secondary | ICD-10-CM | POA: Diagnosis not present

## 2020-09-25 ENCOUNTER — Other Ambulatory Visit: Payer: Self-pay | Admitting: Endocrinology

## 2020-09-25 ENCOUNTER — Other Ambulatory Visit (HOSPITAL_COMMUNITY): Payer: Self-pay | Admitting: Endocrinology

## 2020-09-25 DIAGNOSIS — E0590 Thyrotoxicosis, unspecified without thyrotoxic crisis or storm: Secondary | ICD-10-CM

## 2020-09-26 ENCOUNTER — Encounter: Payer: BC Managed Care – PPO | Admitting: Hematology and Oncology

## 2020-09-26 ENCOUNTER — Other Ambulatory Visit: Payer: Self-pay | Admitting: Hematology and Oncology

## 2020-09-26 ENCOUNTER — Other Ambulatory Visit: Payer: BC Managed Care – PPO

## 2020-09-26 DIAGNOSIS — D500 Iron deficiency anemia secondary to blood loss (chronic): Secondary | ICD-10-CM

## 2020-09-26 NOTE — Progress Notes (Signed)
No show

## 2020-10-08 ENCOUNTER — Other Ambulatory Visit: Payer: Self-pay

## 2020-10-08 ENCOUNTER — Encounter (HOSPITAL_COMMUNITY)
Admission: RE | Admit: 2020-10-08 | Discharge: 2020-10-08 | Disposition: A | Discharge status: Home / Self Care | Payer: BC Managed Care – PPO | Source: Ambulatory Visit | Attending: Endocrinology | Admitting: Endocrinology

## 2020-10-08 DIAGNOSIS — E0590 Thyrotoxicosis, unspecified without thyrotoxic crisis or storm: Secondary | ICD-10-CM | POA: Diagnosis not present

## 2020-10-08 MED ORDER — SODIUM IODIDE I-123 7.4 MBQ CAPS
445.0000 | ORAL_CAPSULE | Freq: Once | ORAL | Status: AC
  Administered 2020-10-08: 445 via ORAL

## 2020-10-09 ENCOUNTER — Encounter (HOSPITAL_COMMUNITY)
Admission: RE | Admit: 2020-10-09 | Discharge: 2020-10-09 | Disposition: A | Discharge status: Home / Self Care | Payer: BC Managed Care – PPO | Source: Ambulatory Visit | Attending: Endocrinology | Admitting: Endocrinology

## 2020-10-09 DIAGNOSIS — E0590 Thyrotoxicosis, unspecified without thyrotoxic crisis or storm: Secondary | ICD-10-CM | POA: Diagnosis not present

## 2020-10-10 NOTE — Progress Notes (Signed)
Left message with Sydney Nichols, pt stating she will reschedule 10-16-2020 surgery.

## 2020-10-12 NOTE — H&P (Signed)
Sydney Nichols is an 30 y.o. female G1P1 with AUB/DUB with polyp on Saline Infused Sonohystogram.  She has history of anemia with recent iron infusion, familial history of cancer (breast and ovarian), and subfertility with IVF for conception.  Pt with history of PCOS.  D/W pt r/b/a of hysteroscopy/D&C and possible myosure - including expectations.    Pertinent Gynecological History: G1P1 - LTCS 6#10, female  No abn pap - last 6/19  No STD   Menstrual History: No LMP recorded.    Past Medical History:  Diagnosis Date  . Acne   . Allergy    RHINITIS  . Eczema   . Endometriosis   . Medical history non-contributory   . PCOS (polycystic ovarian syndrome)   . PIH (pregnancy induced hypertension) 09/21/2019  . S/P cesarean section 09/22/2019    Past Surgical History:  Procedure Laterality Date  . CESAREAN SECTION N/A 09/22/2019   Procedure: CESAREAN SECTION;  Surgeon: Sherian Rein, MD;  Location: [facility] LD ORS;  Service: Obstetrics;  Laterality: N/A;    Family History  Problem Relation Age of Onset  . Thyroid disease Father   . Brain cancer Paternal Aunt   . Diabetes Maternal Grandfather   . Cancer Maternal Grandmother   . Diabetes Paternal Grandfather   . Heart disease Paternal Grandfather     Social History:  reports that she has never smoked. She has never used smokeless tobacco. She reports that she does not drink alcohol and does not use drugs. married, [employer] teacher  Allergies:  Allergies  Allergen Reactions  . Hydrocodone-Acetaminophen Rash    Meds: ferosal, metformin, toprol    Review of Systems  Constitutional: Negative.   HENT: Negative.   Respiratory: Negative.   Cardiovascular: Negative.   Gastrointestinal: Negative.   Genitourinary: Negative.   Musculoskeletal: Negative.   Skin: Negative.   Neurological: Negative.   Psychiatric/Behavioral: Negative.     unknown if currently breastfeeding. Physical Exam Constitutional:      Appearance: Normal  appearance.  HENT:     Head: Normocephalic and atraumatic.  Cardiovascular:     Rate and Rhythm: Normal rate and regular rhythm.  Pulmonary:     Effort: Pulmonary effort is normal.     Breath sounds: Normal breath sounds.  Abdominal:     General: Bowel sounds are normal.     Palpations: Abdomen is soft.  Musculoskeletal:        General: Normal range of motion.     Cervical back: Normal range of motion and neck supple.  Skin:    General: Skin is warm and dry.  Neurological:     General: No focal deficit present.     Mental Status: She is alert and oriented to person, place, and time.  Psychiatric:        Mood and Affect: Mood normal.        Behavior: Behavior normal.   [place] PCOS ovaries, intrauterine polyp   Assessment/Plan: 29yo G1P1 with uterine polyp for hysteroscopy/D&C/possible myosure D/w pt r/b/a of surgery   Krithik Mapel Bovard-Stuckert 10/12/2020, 4:41 PM

## 2020-10-13 ENCOUNTER — Encounter (HOSPITAL_BASED_OUTPATIENT_CLINIC_OR_DEPARTMENT_OTHER): Payer: Self-pay | Admitting: Obstetrics and Gynecology

## 2020-10-13 ENCOUNTER — Other Ambulatory Visit: Payer: Self-pay

## 2020-10-13 NOTE — Progress Notes (Signed)
Spoke w/ via phone for pre-op interview---pt Lab needs dos---- cbc,  urine preg              Lab results------lov dr [facility] endocrinology 09-24-2020 received and placed on pt chart COVID test ------10-15-2020 1230 pm due to pt cannot come until then due to work Arrive at -------600 am 10-16-2020 NPO after MN NO Solid Food.  Clear liquids from MN until---500 am then npo Med rec completed Medications to take morning of surgery -----metorpolol Diabetic medication -----n/a Patient instructed to bring photo id and insurance card day of surgery Patient aware to have Driver (ride ) / caregiver  John spouse weill drop pt off and comb back    for 24 hours after surgery  Patient Special Instructions -----none Pre-Op special Istructions -----nonePatient verbalized understanding of instructions that were given at this phone interview. Patient denies shortness of breath, chest pain, fever, cough at this phone interview.

## 2020-10-15 ENCOUNTER — Other Ambulatory Visit (HOSPITAL_COMMUNITY): Payer: BC Managed Care – PPO

## 2020-10-15 ENCOUNTER — Other Ambulatory Visit (HOSPITAL_COMMUNITY)
Admission: RE | Admit: 2020-10-15 | Discharge: 2020-10-15 | Disposition: A | Discharge status: Home / Self Care | Payer: BC Managed Care – PPO | Source: Ambulatory Visit | Attending: Obstetrics and Gynecology | Admitting: Obstetrics and Gynecology

## 2020-10-15 DIAGNOSIS — Z8759 Personal history of other complications of pregnancy, childbirth and the puerperium: Secondary | ICD-10-CM | POA: Diagnosis not present

## 2020-10-15 DIAGNOSIS — Z20822 Contact with and (suspected) exposure to covid-19: Secondary | ICD-10-CM | POA: Insufficient documentation

## 2020-10-15 DIAGNOSIS — Z01812 Encounter for preprocedural laboratory examination: Secondary | ICD-10-CM | POA: Insufficient documentation

## 2020-10-15 DIAGNOSIS — E282 Polycystic ovarian syndrome: Secondary | ICD-10-CM | POA: Diagnosis not present

## 2020-10-15 DIAGNOSIS — Z885 Allergy status to narcotic agent status: Secondary | ICD-10-CM | POA: Diagnosis not present

## 2020-10-15 DIAGNOSIS — Z8249 Family history of ischemic heart disease and other diseases of the circulatory system: Secondary | ICD-10-CM | POA: Diagnosis not present

## 2020-10-15 DIAGNOSIS — N840 Polyp of corpus uteri: Secondary | ICD-10-CM | POA: Diagnosis not present

## 2020-10-15 DIAGNOSIS — Z98891 History of uterine scar from previous surgery: Secondary | ICD-10-CM | POA: Diagnosis not present

## 2020-10-15 DIAGNOSIS — N939 Abnormal uterine and vaginal bleeding, unspecified: Secondary | ICD-10-CM | POA: Diagnosis not present

## 2020-10-15 LAB — SARS CORONAVIRUS 2 (TAT 6-24 HRS): SARS Coronavirus 2: NEGATIVE

## 2020-10-16 ENCOUNTER — Encounter (HOSPITAL_BASED_OUTPATIENT_CLINIC_OR_DEPARTMENT_OTHER)
Admission: RE | Disposition: A | Discharge status: Home / Self Care | Payer: Self-pay | Source: Ambulatory Visit | Attending: Obstetrics and Gynecology

## 2020-10-16 ENCOUNTER — Ambulatory Visit (HOSPITAL_BASED_OUTPATIENT_CLINIC_OR_DEPARTMENT_OTHER): Payer: BC Managed Care – PPO | Admitting: Anesthesiology

## 2020-10-16 ENCOUNTER — Ambulatory Visit (HOSPITAL_BASED_OUTPATIENT_CLINIC_OR_DEPARTMENT_OTHER)
Admission: RE | Admit: 2020-10-16 | Discharge: 2020-10-16 | Disposition: A | Discharge status: Home / Self Care | Payer: BC Managed Care – PPO | Source: Ambulatory Visit | Attending: Obstetrics and Gynecology | Admitting: Obstetrics and Gynecology

## 2020-10-16 ENCOUNTER — Encounter (HOSPITAL_BASED_OUTPATIENT_CLINIC_OR_DEPARTMENT_OTHER): Payer: Self-pay | Admitting: Obstetrics and Gynecology

## 2020-10-16 DIAGNOSIS — N939 Abnormal uterine and vaginal bleeding, unspecified: Secondary | ICD-10-CM | POA: Insufficient documentation

## 2020-10-16 DIAGNOSIS — N840 Polyp of corpus uteri: Secondary | ICD-10-CM | POA: Diagnosis not present

## 2020-10-16 DIAGNOSIS — Z885 Allergy status to narcotic agent status: Secondary | ICD-10-CM | POA: Diagnosis not present

## 2020-10-16 DIAGNOSIS — Z98891 History of uterine scar from previous surgery: Secondary | ICD-10-CM | POA: Diagnosis not present

## 2020-10-16 DIAGNOSIS — Z8249 Family history of ischemic heart disease and other diseases of the circulatory system: Secondary | ICD-10-CM | POA: Insufficient documentation

## 2020-10-16 DIAGNOSIS — Z8759 Personal history of other complications of pregnancy, childbirth and the puerperium: Secondary | ICD-10-CM | POA: Insufficient documentation

## 2020-10-16 DIAGNOSIS — E282 Polycystic ovarian syndrome: Secondary | ICD-10-CM | POA: Insufficient documentation

## 2020-10-16 DIAGNOSIS — I10 Essential (primary) hypertension: Secondary | ICD-10-CM | POA: Diagnosis not present

## 2020-10-16 DIAGNOSIS — Z20822 Contact with and (suspected) exposure to covid-19: Secondary | ICD-10-CM | POA: Insufficient documentation

## 2020-10-16 DIAGNOSIS — D259 Leiomyoma of uterus, unspecified: Secondary | ICD-10-CM | POA: Diagnosis not present

## 2020-10-16 HISTORY — DX: Presence of spectacles and contact lenses: Z97.3

## 2020-10-16 HISTORY — DX: Thyrotoxicosis, unspecified without thyrotoxic crisis or storm: E05.90

## 2020-10-16 HISTORY — DX: Anemia, unspecified: D64.9

## 2020-10-16 HISTORY — DX: Unspecified abnormalities of heart beat: R00.9

## 2020-10-16 HISTORY — DX: Unspecified abnormalities of heart beat: R03.0

## 2020-10-16 HISTORY — PX: DILATATION & CURETTAGE/HYSTEROSCOPY WITH MYOSURE: SHX6511

## 2020-10-16 LAB — CBC
HCT: 37.5 % (ref 36.0–46.0)
Hemoglobin: 11.9 g/dL — ABNORMAL LOW (ref 12.0–15.0)
MCH: 24.4 pg — ABNORMAL LOW (ref 26.0–34.0)
MCHC: 31.7 g/dL (ref 30.0–36.0)
MCV: 76.8 fL — ABNORMAL LOW (ref 80.0–100.0)
Platelets: 303 10*3/uL (ref 150–400)
RBC: 4.88 MIL/uL (ref 3.87–5.11)
WBC: 7.3 10*3/uL (ref 4.0–10.5)
nRBC: 0 % (ref 0.0–0.2)

## 2020-10-16 LAB — POCT PREGNANCY, URINE: Preg Test, Ur: NEGATIVE

## 2020-10-16 SURGERY — DILATATION & CURETTAGE/HYSTEROSCOPY WITH MYOSURE
Anesthesia: General | Site: Vagina

## 2020-10-16 MED ORDER — DEXAMETHASONE SODIUM PHOSPHATE 10 MG/ML IJ SOLN
INTRAMUSCULAR | Status: AC
  Filled 2020-10-16: qty 1

## 2020-10-16 MED ORDER — SCOPOLAMINE 1 MG/3DAYS TD PT72
MEDICATED_PATCH | TRANSDERMAL | Status: AC
  Filled 2020-10-16: qty 1

## 2020-10-16 MED ORDER — SODIUM CHLORIDE 0.9 % IR SOLN
Status: DC | PRN
  Administered 2020-10-16: 3000 mL

## 2020-10-16 MED ORDER — LIDOCAINE HCL 1 % IJ SOLN
INTRAMUSCULAR | Status: DC | PRN
  Administered 2020-10-16: 15 mL

## 2020-10-16 MED ORDER — LACTATED RINGERS IV SOLN: INTRAVENOUS | Status: DC

## 2020-10-16 MED ORDER — LIDOCAINE HCL (CARDIAC) PF 100 MG/5ML IV SOSY
PREFILLED_SYRINGE | INTRAVENOUS | Status: DC | PRN
  Administered 2020-10-16: 100 mg via INTRAVENOUS

## 2020-10-16 MED ORDER — DOXYCYCLINE HYCLATE 100 MG PO TABS
100.0000 mg | ORAL_TABLET | Freq: Two times a day (BID) | ORAL | Status: DC
  Administered 2020-10-16: 100 mg via ORAL

## 2020-10-16 MED ORDER — KETOROLAC TROMETHAMINE 30 MG/ML IJ SOLN: 30.0000 mg | Freq: Once | INTRAMUSCULAR | Status: DC | PRN

## 2020-10-16 MED ORDER — FENTANYL CITRATE (PF) 100 MCG/2ML IJ SOLN: 25.0000 ug | INTRAMUSCULAR | Status: DC | PRN

## 2020-10-16 MED ORDER — ONDANSETRON HCL 4 MG/2ML IJ SOLN
INTRAMUSCULAR | Status: AC
  Filled 2020-10-16: qty 2

## 2020-10-16 MED ORDER — FENTANYL CITRATE (PF) 100 MCG/2ML IJ SOLN
INTRAMUSCULAR | Status: AC
  Filled 2020-10-16: qty 2

## 2020-10-16 MED ORDER — POVIDONE-IODINE 10 % EX SWAB: 2.0000 "application " | Freq: Once | CUTANEOUS | Status: DC

## 2020-10-16 MED ORDER — KETOROLAC TROMETHAMINE 30 MG/ML IJ SOLN
INTRAMUSCULAR | Status: AC
  Filled 2020-10-16: qty 1

## 2020-10-16 MED ORDER — ONDANSETRON HCL 4 MG/2ML IJ SOLN: 4.0000 mg | Freq: Once | INTRAMUSCULAR | Status: DC | PRN

## 2020-10-16 MED ORDER — IBUPROFEN 800 MG PO TABS: 800.0000 mg | ORAL_TABLET | Freq: Three times a day (TID) | ORAL | 1 refills | Status: DC | PRN

## 2020-10-16 MED ORDER — DOXYCYCLINE HYCLATE 100 MG PO TABS
ORAL_TABLET | ORAL | Status: AC
  Filled 2020-10-16: qty 1

## 2020-10-16 MED ORDER — METOCLOPRAMIDE HCL 5 MG/ML IJ SOLN
INTRAMUSCULAR | Status: DC | PRN
  Administered 2020-10-16 (×2): 5 mg via INTRAVENOUS

## 2020-10-16 MED ORDER — PROPOFOL 10 MG/ML IV BOLUS
INTRAVENOUS | Status: AC
  Filled 2020-10-16: qty 60

## 2020-10-16 MED ORDER — FENTANYL CITRATE (PF) 100 MCG/2ML IJ SOLN
INTRAMUSCULAR | Status: DC | PRN
  Administered 2020-10-16: 25 ug via INTRAVENOUS
  Administered 2020-10-16: 50 ug via INTRAVENOUS

## 2020-10-16 MED ORDER — METOCLOPRAMIDE HCL 5 MG/ML IJ SOLN
INTRAMUSCULAR | Status: AC
  Filled 2020-10-16: qty 2

## 2020-10-16 MED ORDER — MIDAZOLAM HCL 2 MG/2ML IJ SOLN
INTRAMUSCULAR | Status: AC
  Filled 2020-10-16: qty 2

## 2020-10-16 MED ORDER — DEXAMETHASONE SODIUM PHOSPHATE 4 MG/ML IJ SOLN
INTRAMUSCULAR | Status: DC | PRN
  Administered 2020-10-16: 8 mg via INTRAVENOUS

## 2020-10-16 MED ORDER — PROPOFOL 10 MG/ML IV BOLUS
INTRAVENOUS | Status: DC | PRN
  Administered 2020-10-16: 30 mg via INTRAVENOUS
  Administered 2020-10-16: 150 mg via INTRAVENOUS
  Administered 2020-10-16: 30 mg via INTRAVENOUS

## 2020-10-16 MED ORDER — MIDAZOLAM HCL 2 MG/2ML IJ SOLN
INTRAMUSCULAR | Status: DC | PRN
  Administered 2020-10-16: 1 mg via INTRAVENOUS

## 2020-10-16 MED ORDER — LIDOCAINE 2% (20 MG/ML) 5 ML SYRINGE
INTRAMUSCULAR | Status: AC
  Filled 2020-10-16: qty 5

## 2020-10-16 MED ORDER — GLYCOPYRROLATE PF 0.2 MG/ML IJ SOSY
PREFILLED_SYRINGE | INTRAMUSCULAR | Status: AC
  Filled 2020-10-16: qty 2

## 2020-10-16 MED ORDER — SCOPOLAMINE 1 MG/3DAYS TD PT72
1.0000 | MEDICATED_PATCH | TRANSDERMAL | Status: DC
  Administered 2020-10-16: 1.5 mg via TRANSDERMAL

## 2020-10-16 MED ORDER — KETOROLAC TROMETHAMINE 30 MG/ML IJ SOLN
INTRAMUSCULAR | Status: DC | PRN
  Administered 2020-10-16: 30 mg via INTRAVENOUS

## 2020-10-16 MED ORDER — ONDANSETRON HCL 4 MG/2ML IJ SOLN
4.0000 mg | Freq: Once | INTRAMUSCULAR | Status: AC
  Administered 2020-10-16: 4 mg via INTRAVENOUS

## 2020-10-16 MED ORDER — GLYCOPYRROLATE 0.2 MG/ML IJ SOLN
INTRAMUSCULAR | Status: DC | PRN
  Administered 2020-10-16 (×2): .1 mg via INTRAVENOUS

## 2020-10-16 MED ORDER — DROPERIDOL 2.5 MG/ML IJ SOLN
INTRAMUSCULAR | Status: DC | PRN
  Administered 2020-10-16: .625 mg via INTRAVENOUS

## 2020-10-16 MED ORDER — OXYCODONE HCL 5 MG PO TABS: 5.0000 mg | ORAL_TABLET | Freq: Four times a day (QID) | ORAL | 0 refills | Status: DC | PRN

## 2020-10-16 SURGICAL SUPPLY — 20 items
ABLATOR SURESOUND NOVASURE (ABLATOR) IMPLANT
CATH ROBINSON RED A/P 16FR (CATHETERS) ×2 IMPLANT
COVER WAND RF STERILE (DRAPES) ×2 IMPLANT
DEVICE MYOSURE LITE (MISCELLANEOUS) IMPLANT
DEVICE MYOSURE REACH (MISCELLANEOUS) ×2 IMPLANT
DILATOR CANAL MILEX (MISCELLANEOUS) IMPLANT
GAUZE 4X4 16PLY RFD (DISPOSABLE) ×2 IMPLANT
GLOVE SURG ENC MOIS LTX SZ6.5 (GLOVE) ×2 IMPLANT
GLOVE SURG UNDER POLY LF SZ7 (GLOVE) ×8 IMPLANT
GOWN STRL REUS W/TWL LRG LVL3 (GOWN DISPOSABLE) ×4 IMPLANT
GOWN STRL REUS W/TWL XL LVL3 (GOWN DISPOSABLE) ×2 IMPLANT
IV NS IRRIG 3000ML ARTHROMATIC (IV SOLUTION) ×2 IMPLANT
KIT PROCEDURE FLUENT (KITS) ×2 IMPLANT
KIT TURNOVER CYSTO (KITS) ×2 IMPLANT
PACK VAGINAL MINOR WOMEN LF (CUSTOM PROCEDURE TRAY) ×2 IMPLANT
PAD OB MATERNITY 4.3X12.25 (PERSONAL CARE ITEMS) ×2 IMPLANT
SEAL CERVICAL OMNI LOK (ABLATOR) IMPLANT
SEAL ROD LENS SCOPE MYOSURE (ABLATOR) ×2 IMPLANT
SUT VIC AB 3-0 SH 27 (SUTURE) ×2
SUT VIC AB 3-0 SH 27X BRD (SUTURE) ×1 IMPLANT

## 2020-10-16 NOTE — Anesthesia Preprocedure Evaluation (Signed)
Anesthesia Evaluation  Patient identified by MRN, date of birth, ID band Patient awake    Reviewed: Allergy & Precautions, NPO status , Patient's Chart, lab work & pertinent test results  Airway Mallampati: II  TM Distance: >3 FB Neck ROM: Full    Dental no notable dental hx.    Pulmonary neg pulmonary ROS,    Pulmonary exam normal breath sounds clear to auscultation       Cardiovascular Normal cardiovascular exam Rhythm:Regular Rate:Normal     Neuro/Psych negative neurological ROS  negative psych ROS   GI/Hepatic negative GI ROS, Neg liver ROS,   Endo/Other  negative endocrine ROS  Renal/GU negative Renal ROS  negative genitourinary   Musculoskeletal negative musculoskeletal ROS (+)   Abdominal   Peds negative pediatric ROS (+)  Hematology negative hematology ROS (+)   Anesthesia Other Findings   Reproductive/Obstetrics negative OB ROS                             Anesthesia Physical Anesthesia Plan  ASA: II  Anesthesia Plan: General   Post-op Pain Management:    Induction: Intravenous  PONV Risk Score and Plan: 3 and Ondansetron, Dexamethasone, Midazolam and Treatment may vary due to age or medical condition  Airway Management Planned: LMA  Additional Equipment:   Intra-op Plan:   Post-operative Plan: Extubation in OR  Informed Consent: I have reviewed the patients History and Physical, chart, labs and discussed the procedure including the risks, benefits and alternatives for the proposed anesthesia with the patient or authorized representative who has indicated his/her understanding and acceptance.     Dental advisory given  Plan Discussed with: CRNA and Surgeon  Anesthesia Plan Comments:         Anesthesia Quick Evaluation

## 2020-10-16 NOTE — Interval H&P Note (Signed)
History and Physical Interval Note:  10/16/2020 7:41 AM  Sydney Nichols  has presented today for surgery, with the diagnosis of polyp of corpus uteri.  The various methods of treatment have been discussed with the patient and family. After consideration of risks, benefits and other options for treatment, the patient has consented to  Procedure(s): DILATATION & CURETTAGE/HYSTEROSCOPY WITH MYOSURE (N/A) as a surgical intervention.  The patient's history has been reviewed, patient examined, no change in status, stable for surgery.  I have reviewed the patient's chart and labs.  Questions were answered to the patient's satisfaction.     Dereonna Lensing Bovard-Stuckert

## 2020-10-16 NOTE — Anesthesia Procedure Notes (Signed)
Procedure Name: LMA Insertion Date/Time: 10/16/2020 8:04 AM Performed by: Earmon Phoenix, CRNA Pre-anesthesia Checklist: Patient identified, Emergency Drugs available, Suction available, Patient being monitored and Timeout performed Patient Re-evaluated:Patient Re-evaluated prior to induction Oxygen Delivery Method: Circle system utilized Preoxygenation: Pre-oxygenation with 100% oxygen Induction Type: IV induction Ventilation: Mask ventilation without difficulty LMA: LMA inserted LMA Size: 4.0 Placement Confirmation: positive ETCO2,  CO2 detector and breath sounds checked- equal and bilateral Tube secured with: Tape Dental Injury: Teeth and Oropharynx as per pre-operative assessment

## 2020-10-16 NOTE — Anesthesia Postprocedure Evaluation (Signed)
Anesthesia Post Note  Patient: Sydney Nichols  Procedure(s) Performed: DILATATION & CURETTAGE/HYSTEROSCOPY WITH MYOSURE (N/A Vagina )     Patient location during evaluation: PACU Anesthesia Type: General Level of consciousness: awake and alert Pain management: pain level controlled Vital Signs Assessment: post-procedure vital signs reviewed and stable Respiratory status: spontaneous breathing, nonlabored ventilation, respiratory function stable and patient connected to nasal cannula oxygen Cardiovascular status: blood pressure returned to baseline and stable Postop Assessment: no apparent nausea or vomiting Anesthetic complications: no   No complications documented.  Last Vitals:  Vitals:   10/16/20 0930 10/16/20 1010  BP: (!) 95/57 (!) 103/52  Pulse: (!) 59 (!) 55  Resp: 12 16  Temp:  36.6 C  SpO2: 100% 100%    Last Pain:  Vitals:   10/16/20 1010  TempSrc:   PainSc: 0-No pain                 [name]

## 2020-10-16 NOTE — Discharge Instructions (Signed)
DISCHARGE INSTRUCTIONS: HYSTEROSCOPY / ENDOMETRIAL ABLATION The following instructions have been prepared to help you care for yourself upon your return home.  May Remove Scop patch on or before  May take Ibuprofen after  May take stool softner while taking narcotic pain medication to prevent constipation.  Drink plenty of water.  Personal hygiene: Marland Kitchen Use sanitary pads for vaginal drainage, not tampons. . Shower the day after your procedure. . NO tub baths, pools or Jacuzzis for 2-3 weeks. . Wipe front to back after using the bathroom.  Activity and limitations: . Do NOT drive or operate any equipment for 24 hours. The effects of anesthesia are still present and drowsiness may result. . Do NOT rest in bed all day. . Walking is encouraged. . Walk up and down stairs slowly. . You may resume your normal activity in one to two days or as indicated by your physician. Sexual activity: NO intercourse for at least 2 weeks after the procedure, or as indicated by your Doctor.  Diet: Eat a light meal as desired this evening. You may resume your usual diet tomorrow.  Return to Work: You may resume your work activities in one to two days or as indicated by Therapist, sports.  What to expect after your surgery: Expect to have vaginal bleeding/discharge for 2-3 days and spotting for up to 10 days. It is not unusual to have soreness for up to 1-2 weeks. You may have a slight burning sensation when you urinate for the first day. Mild cramps may continue for a couple of days. You may have a regular period in 2-6 weeks.  Call your doctor for any of the following: . Excessive vaginal bleeding or clotting, saturating and changing one pad every hour. . Inability to urinate 6 hours after discharge from hospital. . Pain not relieved by pain medication. . Fever of 100.4 F or greater. . Unusual vaginal discharge or odor.   Post Anesthesia Care Unit [phone] Post Anesthesia Home Care  Instruction    Activity: Get plenty of rest for the remainder of the day. A responsible individual must stay with you for 24 hours following the procedure.  For the next 24 hours, DO NOT: -Drive a car -Advertising copywriter -Drink alcoholic beverages -Take any medication unless instructed by your physician -Make any legal decisions or sign important papers.  Meals: Start with liquid foods such as gelatin or soup. Progress to regular foods as tolerated. Avoid greasy, spicy, heavy foods. If nausea and/or vomiting occur, drink only clear liquids until the nausea and/or vomiting subsides. Call your physician if vomiting continues.  Special Instructions/Symptoms: Your throat may feel dry or sore from the anesthesia or the breathing tube placed in your throat during surgery. If this causes discomfort, gargle with warm salt water. The discomfort should disappear within 24 hours.  If you had a scopolamine patch placed behind your ear for the management of post- operative nausea and/or vomiting:  1. The medication in the patch is effective for 72 hours, after which it should be removed.  Wrap patch in a tissue and discard in the trash. Wash hands thoroughly with soap and water. 2. You may remove the patch earlier than 72 hours if you experience unpleasant side effects which may include dry mouth, dizziness or visual disturbances. 3. Avoid touching the patch. Wash your hands with soap and water after contact with the patch.

## 2020-10-16 NOTE — Brief Op Note (Signed)
10/16/2020  8:45 AM  PATIENT:  Sydney Nichols  30 y.o. female  PRE-OPERATIVE DIAGNOSIS:  polyp of corpus uteri  POST-OPERATIVE DIAGNOSIS:  polyp of corpus uteri  PROCEDURE:  Procedure(s): DILATATION & CURETTAGE/HYSTEROSCOPY WITH MYOSURE (N/A)  SURGEON:  Surgeon(s) and Role:    * Bovard-Stuckert, Enisa Runyan, MD - Primary  ANESTHESIA:   local and general by LMA  EBL:  5 mL IVF per anesthesia, voided directly before case  BLOOD ADMINISTERED:none  DRAINS: none   LOCAL MEDICATIONS USED:  LIDOCAINE   SPECIMEN:  Source of Specimen:  endometrial curretings/polyp  DISPOSITION OF SPECIMEN:  PATHOLOGY  COUNTS:  YES  TOURNIQUET:  * No tourniquets in log *  DICTATION: .Other Dictation: Dictation Number [id number]  PLAN OF CARE: Discharge to home after PACU  PATIENT DISPOSITION:  PACU - hemodynamically stable.   Delay start of Pharmacological VTE agent (>24hrs) due to surgical blood loss or risk of bleeding: not applicable

## 2020-10-16 NOTE — Op Note (Signed)
NAME: Sydney Nichols, Sydney Nichols MEDICAL RECORD NO: [id number] ACCOUNT NO: [id number] DATE OF BIRTH: [birth year] FACILITY: [facility] LOCATION: [facility] PHYSICIAN: Sherian Rein, MD  Operative Report   DATE OF PROCEDURE: 10/16/2020  PREOPERATIVE DIAGNOSIS:  Uterine polyp with abnormal uterine bleeding.  POSTOPERATIVE DIAGNOSES:  Uterine polyp with abnormal uterine bleeding.  PROCEDURE:  Diagnostic hysteroscopy, polypectomy with MyoSure device.  SURGEON:  Sherian Rein, MD  ANESTHESIA:  Local and general by LMA.  ESTIMATED BLOOD LOSS:  Approximately 5 mL.  INTRAVENOUS FLUIDS:  Per Anesthesia.  URINE OUTPUT:  She voided directly before the case.  COMPLICATIONS:  None.  PATHOLOGY:  Endometrial curettings, polyp.  DESCRIPTION OF PROCEDURE:  After informed consent was reviewed with the patient including risks, benefits and alternatives of the surgical procedure.  She was transferred to the operating room and placed on the table in supine position.  General  anesthesia was induced and an LMA was placed.  She was then placed in the [place] stirrups, prepped and draped in the normal sterile fashion.  An open sided speculum was used to visualize her cervix easily.  Her cervix was grasped with a single tooth  tenaculum after being injected for paracervical block with 10 mL of lidocaine.  Her cervix was dilated with Pratt dilators to accommodate a 5 mm hysteroscope.  The brief survey of her uterus revealed ostia as well as a thickened lining on the posterior  aspect as well as a polyp thickened lining on the anterior aspect of her uterus.  Using a MyoSure device this area was trimmed and sent to pathology.  Prior to the completion of the case, a normal-appearing cavity without this thickened areas was noted.   The patient tolerated the procedure well.  Sponge, lap and needle counts were correct x2 per the operating staff.  A single suture was placed in her cervix where the single tooth  tenaculum had torn the sutures with 3-0 Vicryl.   PUS D: 10/16/2020 8:53:57 am T: 10/16/2020 10:19:00 am  JOB: [id number]/ [id number]

## 2020-10-16 NOTE — Transfer of Care (Signed)
Immediate Anesthesia Transfer of Care Note  Patient: Sydney Nichols  Procedure(s) Performed: DILATATION & CURETTAGE/HYSTEROSCOPY WITH MYOSURE (N/A Vagina )  Patient Location: PACU  Anesthesia Type:General  Level of Consciousness: drowsy and patient cooperative  Airway & Oxygen Therapy: Patient Spontanous Breathing and Patient connected to nasal cannula oxygen  Post-op Assessment: Report given to RN and Post -op Vital signs reviewed and stable  Post vital signs: Reviewed and stable  Last Vitals:  Vitals Value Taken Time  BP 114/73 10/16/20 0850  Temp    Pulse 67 10/16/20 0852  Resp 10 10/16/20 0852  SpO2 100 % 10/16/20 0852  Vitals shown include unvalidated device data.  Last Pain:  Vitals:   10/16/20 0629  TempSrc: Oral  PainSc: 0-No pain      Patients Stated Pain Goal: 5 (10/16/20 2956)  Complications: No complications documented.

## 2020-10-17 ENCOUNTER — Encounter (HOSPITAL_BASED_OUTPATIENT_CLINIC_OR_DEPARTMENT_OTHER): Payer: Self-pay | Admitting: Obstetrics and Gynecology

## 2020-10-17 LAB — SURGICAL PATHOLOGY

## 2020-10-31 ENCOUNTER — Encounter: Payer: Self-pay | Admitting: Internal Medicine

## 2020-12-01 DIAGNOSIS — R079 Chest pain, unspecified: Secondary | ICD-10-CM | POA: Diagnosis not present

## 2021-01-15 DIAGNOSIS — W108XXA Fall (on) (from) other stairs and steps, initial encounter: Secondary | ICD-10-CM | POA: Diagnosis not present

## 2021-01-15 DIAGNOSIS — S93402A Sprain of unspecified ligament of left ankle, initial encounter: Secondary | ICD-10-CM | POA: Diagnosis not present

## 2021-01-15 DIAGNOSIS — S93602A Unspecified sprain of left foot, initial encounter: Secondary | ICD-10-CM | POA: Diagnosis not present

## 2021-07-28 ENCOUNTER — Other Ambulatory Visit: Payer: Self-pay

## 2021-07-28 ENCOUNTER — Telehealth: Payer: Self-pay

## 2021-07-28 ENCOUNTER — Telehealth (INDEPENDENT_AMBULATORY_CARE_PROVIDER_SITE_OTHER): Payer: BC Managed Care – PPO | Admitting: Family Medicine

## 2021-07-28 ENCOUNTER — Other Ambulatory Visit: Payer: BC Managed Care – PPO

## 2021-07-28 ENCOUNTER — Encounter: Payer: Self-pay | Admitting: Family Medicine

## 2021-07-28 VITALS — Temp 98.0°F | Wt 181.0 lb

## 2021-07-28 DIAGNOSIS — J029 Acute pharyngitis, unspecified: Secondary | ICD-10-CM

## 2021-07-28 LAB — POC COVID19 BINAXNOW: SARS Coronavirus 2 Ag: NEGATIVE

## 2021-07-28 LAB — POCT RAPID STREP A (OFFICE): Rapid Strep A Screen: NEGATIVE

## 2021-07-28 LAB — POCT INFLUENZA A/B
Influenza A, POC: NEGATIVE
Influenza B, POC: NEGATIVE

## 2021-07-28 NOTE — Progress Notes (Signed)
° °  Subjective:    Patient ID: Sydney Nichols, female    DOB: Nov 21, 1990, 31 y.o.   MRN: [id number]  HPI Documentation for virtual audio and video telecommunications through Caregility encounter:  The patient was located at home. 2 patient identifiers used.  The provider was located in the office. The patient did consent to this visit and is aware of possible charges through their insurance for this visit. The other persons participating in this telemedicine service were none. Time spent on call was 5 minutes and in review of previous records >15 minutes total for counseling and coordination of care. This virtual service is not related to other E/M service within previous 7 days.    Review of Systems     Objective:   Physical Exam Alert and in no distress otherwise not examined       Assessment & Plan:  Sore throat - Plan: POC COVID-19, Influenza A/B, Rapid Strep A, Novel Coronavirus, NAA ([facility]), Novel Coronavirus, NAA ([facility]), Rapid Strep A, Influenza A/B, POC COVID-19 The test were negative except for the PCR which will come back in a couple days.  She was instructed to use OTC meds for her symptoms

## 2021-07-28 NOTE — Telephone Encounter (Signed)
Pt flu , strep, and covid all are negative. Pcr was sent off and pt was advised [facility]

## 2021-07-28 NOTE — Progress Notes (Signed)
Covid , flu and strep test all negative.

## 2021-07-29 ENCOUNTER — Telehealth: Payer: Self-pay

## 2021-07-29 LAB — SARS-COV-2, NAA 2 DAY TAT

## 2021-07-29 LAB — NOVEL CORONAVIRUS, NAA: SARS-CoV-2, NAA: NOT DETECTED

## 2021-07-29 NOTE — Telephone Encounter (Signed)
Error

## 2021-09-17 ENCOUNTER — Other Ambulatory Visit: Payer: Self-pay

## 2021-09-17 ENCOUNTER — Ambulatory Visit (INDEPENDENT_AMBULATORY_CARE_PROVIDER_SITE_OTHER): Payer: BC Managed Care – PPO | Admitting: Physician Assistant

## 2021-09-17 ENCOUNTER — Encounter: Payer: Self-pay | Admitting: Physician Assistant

## 2021-09-17 VITALS — BP 120/70 | HR 79 | Ht 64.0 in | Wt 207.0 lb

## 2021-09-17 DIAGNOSIS — F419 Anxiety disorder, unspecified: Secondary | ICD-10-CM | POA: Diagnosis not present

## 2021-09-17 DIAGNOSIS — D508 Other iron deficiency anemias: Secondary | ICD-10-CM

## 2021-09-17 DIAGNOSIS — D509 Iron deficiency anemia, unspecified: Secondary | ICD-10-CM | POA: Insufficient documentation

## 2021-09-17 MED ORDER — ESCITALOPRAM OXALATE 10 MG PO TABS: 10.0000 mg | ORAL_TABLET | Freq: Every day | ORAL | 6 refills | Status: DC

## 2021-09-17 NOTE — Progress Notes (Addendum)
? ?Acute Office Visit ? ?Subjective:  ? ? Patient ID: Sydney Nichols, female    DOB: 1990-09-03, 31 y.o.   MRN: [id number] ? ?Chief Complaint  ?Patient presents with  ? Anemia  ?  Pt feels like her iron is low because she is always tired. Pt also has been having a lot of anxiety due to a new job.  ? ? ?HPI ?Patient is in today for a follow up appointment. Reports that she is feeling like her iron is low again; states she's had iron infusions after her son was born (almost 66 yo). 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  ? ?Hematology - Orson Slick, MD ? ?Past Medical History:  ?Diagnosis Date  ? Acne   ? Allergy   ? RHINITIS  ? Anemia   ? iron iv  x feb 2022,  ? Eczema   ? Elevated heart rate with elevated blood pressure without diagnosis of hypertension   ? seeing dr balan ? graves disease  ? Endometriosis   ? History of blood transfusion 09/2019  ? after c section  ? History of gestational hypertension 09/2019  ? resolved  ? Medical history non-contributory   ? PCOS (polycystic ovarian syndrome)   ? S/P cesarean section 09/22/2019  ? Thyrotoxicosis dx 2022  ? without thryoid storm lov dr Chalmers Cater 09-24-2020 endocrinology on chart  ? Wears glasses   ? ? ?Past Surgical History:  ?Procedure Laterality Date  ? CESAREAN SECTION N/A 09/22/2019  ? Procedure: CESAREAN SECTION;  Surgeon: Janyth Contes, MD;  Location: [place] [facility];  Service: Obstetrics;  Laterality: N/A;  ? CHOLECYSTECTOMY  11/2019  ? laparoscopic  ? DILATATION & CURETTAGE/HYSTEROSCOPY WITH MYOSURE N/A 10/16/2020  ? Procedure: [place];  Surgeon: Janyth Contes, MD;  Location: [facility];  Service: Gynecology;  Laterality: N/A;  ? egg retrieval  12/2018  ? ? ?Family History  ?Problem Relation Age of Onset  ? Thyroid disease Father   ? Brain cancer Paternal Aunt   ? Diabetes Maternal Grandfather   ? Cancer Maternal Grandmother   ?  Diabetes Paternal Grandfather   ? Heart disease Paternal Grandfather   ? ? ?Social History  ? ?Socioeconomic History  ? Marital status: Married  ?  Spouse name: Not on file  ? Number of children: Not on file  ? Years of education: Not on file  ? Highest education level: Not on file  ?Occupational History  ? Not on file  ?Tobacco Use  ? Smoking status: Never  ? Smokeless tobacco: Never  ?Vaping Use  ? Vaping Use: Never used  ?Substance and Sexual Activity  ? Alcohol use: No  ? Drug use: No  ? Sexual activity: Yes  ?  Partners: Male  ?  Birth control/protection: None  ?Other Topics Concern  ? Not on file  ?Social History Narrative  ? Not on file  ? ?Social Determinants of Health  ? ?Financial Resource Strain: Not on file  ?Food Insecurity: Not on file  ?Transportation Needs: Not on file  ?Physical Activity: Not on file  ?Stress: Not on file  ?Social Connections: Not on file  ?Intimate Partner Violence: Not on file  ? ? ?Outpatient Medications Prior to Visit  ?Medication Sig Dispense Refill  ? metFORMIN (GLUCOPHAGE) 1000 MG tablet Take 1,000 mg by mouth 2 (two) times daily with a meal. For pcos    ?  Ferrous Sulfate (IRON) 325 (65 Fe) MG TABS Take by mouth daily. (Patient not taking: Reported on 07/28/2021)    ? ibuprofen (ADVIL) 800 MG tablet Take 1 tablet (800 mg total) by mouth every 8 (eight) hours as needed for moderate pain. (Patient not taking: Reported on 07/28/2021) 30 tablet 1  ? metoprolol succinate (TOPROL-XL) 25 MG 24 hr tablet Take 25 mg by mouth daily. (Patient not taking: Reported on 07/28/2021)    ? oxyCODONE (ROXICODONE) 5 MG immediate release tablet Take 1 tablet (5 mg total) by mouth every 6 (six) hours as needed for severe pain. (Patient not taking: Reported on 07/28/2021) 5 tablet 0  ? ?No facility-administered medications prior to visit.  ? ? ?Allergies  ?Allergen Reactions  ? Hydrocodone-Acetaminophen Rash  ? ? ?Review of Systems  ?Constitutional:  Positive for fatigue. Negative for activity change  and chills.  ?HENT:  Negative for congestion and voice change.   ?Eyes:  Negative for pain and redness.  ?Respiratory:  Negative for cough and wheezing.   ?Cardiovascular:  Negative for chest pain.  ?Gastrointestinal:  Negative for constipation, diarrhea, nausea and vomiting.  ?Endocrine: Negative for polyuria.  ?Genitourinary:  Negative for frequency.  ?Skin:  Negative for color change and rash.  ?Allergic/Immunologic: Negative for immunocompromised state.  ?Neurological:  Negative for dizziness.  ?Psychiatric/Behavioral:  Negative for agitation.   ? ?   ?Objective:  ?  ?Physical Exam ?Vitals and nursing note reviewed.  ?Constitutional:   ?   General: She is not in acute distress. ?   Appearance: Normal appearance. She is not ill-appearing.  ?HENT:  ?   Head: Normocephalic and atraumatic.  ?   Right Ear: External ear normal.  ?   Left Ear: External ear normal.  ?   Nose: No congestion.  ?Eyes:  ?   Extraocular Movements: Extraocular movements intact.  ?   Conjunctiva/sclera: Conjunctivae normal.  ?   Pupils: Pupils are equal, round, and reactive to light.  ?Cardiovascular:  ?   Rate and Rhythm: Normal rate and regular rhythm.  ?   Pulses: Normal pulses.  ?   Heart sounds: Normal heart sounds.  ?Pulmonary:  ?   Effort: Pulmonary effort is normal.  ?   Breath sounds: Normal breath sounds. No wheezing.  ?Abdominal:  ?   General: Bowel sounds are normal.  ?   Palpations: Abdomen is soft.  ?Musculoskeletal:  ?   Cervical back: Normal range of motion and neck supple.  ?   Right lower leg: No edema.  ?   Left lower leg: No edema.  ?Skin: ?   General: Skin is warm and dry.  ?   Findings: No bruising.  ?Neurological:  ?   General: No focal deficit present.  ?   Mental Status: She is alert and oriented to person, place, and time.  ?Psychiatric:     ?   Mood and Affect: Mood normal.     ?   Behavior: Behavior normal.     ?   Thought Content: Thought content normal.  ? ? ?BP 120/70   Pulse 79   Wt 207 lb (93.9 kg)   LMP   (LMP Unknown)   SpO2 98%   BMI 35.53 kg/m?  ? ?Wt Readings from Last 3 Encounters:  ?09/17/21 207 lb (93.9 kg)  ?07/28/21 181 lb (82.1 kg)  ?10/16/20 181 lb 6.4 oz (82.3 kg)  ? ? ?Health Maintenance Due  ?Topic Date Due  ? COVID-19 Vaccine (1) Never  done  ? Hepatitis C Screening  Never done  ? PAP-Cervical Cytology Screening  12/30/2020  ? PAP SMEAR-Modifier  12/30/2020  ? INFLUENZA VACCINE  02/09/2021  ? ? ?There are no preventive care reminders to display for this patient. ? ? ?Lab Results  ?Component Value Date  ? TSH 0.02 (A) 09/02/2020  ? ?Lab Results  ?Component Value Date  ? WBC 7.3 10/16/2020  ? HGB 11.9 (L) 10/16/2020  ? HCT 37.5 10/16/2020  ? MCV 76.8 (L) 10/16/2020  ? PLT 303 10/16/2020  ? ?Lab Results  ?Component Value Date  ? NA 140 11/19/2019  ? K 3.4 (L) 11/19/2019  ? CO2 25 11/19/2019  ? GLUCOSE 155 (H) 11/19/2019  ? BUN 10 11/19/2019  ? CREATININE 0.95 11/19/2019  ? BILITOT 1.3 (H) 11/19/2019  ? ALKPHOS 273 (H) 11/19/2019  ? AST 279 (H) 11/19/2019  ? ALT 275 (H) 11/19/2019  ? PROT 8.3 (H) 11/19/2019  ? ALBUMIN 4.6 11/19/2019  ? CALCIUM 9.3 11/19/2019  ? ANIONGAP 12 11/19/2019  ? ?No results found for: CHOL ?No results found for: HDL ?No results found for: [place] ?No results found for: TRIG ?No results found for: CHOLHDL ?No results found for: HGBA1C ? ?   ?Assessment & Plan:  ? ?Problem List Items Addressed This Visit   ?None ? ? ? ?No orders of the defined types were placed in this encounter. ? ?Follow up in 2 months for annual exam. Patient will research a therapist. ? ?Irene Pap, PA-C ? ?

## 2021-09-18 LAB — FE+CBC/D/PLT+TIBC+FER+RETIC
Basophils Absolute: 0.1 10*3/uL (ref 0.0–0.2)
Basos: 0 %
EOS (ABSOLUTE): 0.3 10*3/uL (ref 0.0–0.4)
Eos: 3 %
Ferritin: 18 ng/mL (ref 15–150)
Hematocrit: 37.4 % (ref 34.0–46.6)
Hemoglobin: 12.4 g/dL (ref 11.1–15.9)
Immature Grans (Abs): 0 10*3/uL (ref 0.0–0.1)
Immature Granulocytes: 0 %
Iron Saturation: 14 % — ABNORMAL LOW (ref 15–55)
Iron: 57 ug/dL (ref 27–159)
Lymphocytes Absolute: 2.6 10*3/uL (ref 0.7–3.1)
Lymphs: 22 %
MCH: 27.9 pg (ref 26.6–33.0)
MCHC: 33.2 g/dL (ref 31.5–35.7)
MCV: 84 fL (ref 79–97)
Monocytes Absolute: 0.8 10*3/uL (ref 0.1–0.9)
Monocytes: 7 %
Neutrophils Absolute: 8 10*3/uL — ABNORMAL HIGH (ref 1.4–7.0)
Neutrophils: 68 %
Platelets: 333 10*3/uL (ref 150–450)
RBC: 4.45 x10E6/uL (ref 3.77–5.28)
RDW: 13 % (ref 11.7–15.4)
Retic Ct Pct: 1.1 % (ref 0.6–2.6)
Total Iron Binding Capacity: 419 ug/dL (ref 250–450)
UIBC: 362 ug/dL (ref 131–425)
WBC: 11.9 10*3/uL — ABNORMAL HIGH (ref 3.4–10.8)

## 2021-09-21 ENCOUNTER — Telehealth: Payer: Self-pay | Admitting: Physician Assistant

## 2021-09-21 NOTE — Telephone Encounter (Signed)
Please call patient to tell her that she can stop taking the Lexapro. She may need to take 1 every other day for a week, 1 every 3rd day for a week and then stop. I can prescribe medicine to help with nausea. Increase water intake to 8 - 10 glasses a day to help flush medicine out of her system.  ?If she wants to try a new medicine, like wellbutrin, I can prescribe it for her to start in 2 - 3 weeks; after she has stopped the Lexapro.

## 2021-09-21 NOTE — Telephone Encounter (Signed)
Pt advised. She said she would like to try Wellbutrin next. ?

## 2021-09-21 NOTE — Telephone Encounter (Signed)
Pt called c/o of nausea with new meds ?She is taking food at nigh with food, when she wakes up with nausea and it lasts until 1-2 in the afternoon  ?And then comes back the next morning ? ?Please call ?

## 2021-09-22 ENCOUNTER — Other Ambulatory Visit: Payer: Self-pay | Admitting: Physician Assistant

## 2021-09-22 DIAGNOSIS — F419 Anxiety disorder, unspecified: Secondary | ICD-10-CM

## 2021-09-22 MED ORDER — BUPROPION HCL 75 MG PO TABS: 75.0000 mg | ORAL_TABLET | Freq: Two times a day (BID) | ORAL | 3 refills | Status: DC

## 2021-09-22 NOTE — Telephone Encounter (Signed)
Wellbutrin ordered to start in 2 weeks

## 2021-09-23 NOTE — Telephone Encounter (Signed)
Pt advised and said thank you .

## 2021-11-24 ENCOUNTER — Encounter: Payer: Self-pay | Admitting: Physician Assistant

## 2021-11-24 ENCOUNTER — Ambulatory Visit (INDEPENDENT_AMBULATORY_CARE_PROVIDER_SITE_OTHER): Payer: BC Managed Care – PPO | Admitting: Physician Assistant

## 2021-11-24 VITALS — BP 110/70 | HR 99 | Ht 64.0 in | Wt 210.8 lb

## 2021-11-24 DIAGNOSIS — Z0000 Encounter for general adult medical examination without abnormal findings: Secondary | ICD-10-CM | POA: Diagnosis not present

## 2021-11-24 DIAGNOSIS — Z6836 Body mass index (BMI) 36.0-36.9, adult: Secondary | ICD-10-CM

## 2021-11-24 DIAGNOSIS — Z83438 Family history of other disorder of lipoprotein metabolism and other lipidemia: Secondary | ICD-10-CM

## 2021-11-24 DIAGNOSIS — E0590 Thyrotoxicosis, unspecified without thyrotoxic crisis or storm: Secondary | ICD-10-CM | POA: Insufficient documentation

## 2021-11-24 DIAGNOSIS — E282 Polycystic ovarian syndrome: Secondary | ICD-10-CM | POA: Diagnosis not present

## 2021-11-24 DIAGNOSIS — E661 Drug-induced obesity: Secondary | ICD-10-CM

## 2021-11-24 DIAGNOSIS — F419 Anxiety disorder, unspecified: Secondary | ICD-10-CM

## 2021-11-24 MED ORDER — BUPROPION HCL ER (SR) 100 MG PO TB12: 100.0000 mg | ORAL_TABLET | Freq: Every day | ORAL | 5 refills | Status: DC

## 2021-11-24 NOTE — Progress Notes (Signed)
Complete physical exam   Patient: Sydney Nichols   DOB: 1990/12/12   31 y.o. Female  MRN: [id number] Visit Date: 11/24/2021  Chief Complaint  Patient presents with   Annual Exam    None fasting Cpe- no other concerns. [facility] for paps   Subjective    Sydney Nichols is a 31 y.o. female who presents today for a complete physical exam.   Reports is generally feeling well; reports that she has been taking wellbutrin 75 mg daily and can't tell if it is helping her anxiety or not; denies adverse reactions from it.  HPI HPI     Annual Exam    Additional comments: None fasting Cpe- no other concerns. [facility] for paps      Last edited by Deforest Hoyles, [place] on 11/24/2021  3:15 PM.        Past Medical History:  Diagnosis Date   Acne    Allergy    RHINITIS   Anemia    iron iv  x feb 2022,   Eczema    Elevated heart rate with elevated blood pressure without diagnosis of hypertension    seeing dr balan ? graves disease   Endometriosis    History of blood transfusion 09/2019   after c section   History of gestational hypertension 09/2019   resolved   Medical history non-contributory    Normal postpartum course 09/22/2019   PCOS (polycystic ovarian syndrome)    Pregnant 09/21/2019   S/P cesarean section 09/22/2019   Thyrotoxicosis dx 2022   without thryoid storm lov dr Chalmers Cater 09-24-2020 endocrinology on chart   Wears glasses    Past Surgical History:  Procedure Laterality Date   CESAREAN SECTION N/A 09/22/2019   Procedure: CESAREAN SECTION;  Surgeon: Janyth Contes, MD;  Location: [facility];  Service: Obstetrics;  Laterality: N/A;   CHOLECYSTECTOMY  11/2019   laparoscopic   DILATATION & CURETTAGE/HYSTEROSCOPY WITH MYOSURE N/A 10/16/2020   Procedure: DILATATION & CURETTAGE/HYSTEROSCOPY WITH MYOSURE;  Surgeon: Janyth Contes, MD;  Location: [place];  Service: Gynecology;  Laterality: N/A;   egg retrieval  12/2018   Social  History   Socioeconomic History   Marital status: Married    Spouse name: Not on file   Number of children: Not on file   Years of education: Not on file   Highest education level: Not on file  Occupational History   Not on file  Tobacco Use   Smoking status: Never   Smokeless tobacco: Never  Vaping Use   Vaping Use: Never used  Substance and Sexual Activity   Alcohol use: No   Drug use: No   Sexual activity: Yes    Partners: Male    Birth control/protection: None  Other Topics Concern   Not on file  Social History Narrative   Not on file   Social Determinants of Health   Financial Resource Strain: Not on file  Food Insecurity: Not on file  Transportation Needs: Not on file  Physical Activity: Not on file  Stress: Not on file  Social Connections: Not on file  Intimate Partner Violence: Not on file   Family Status  Relation Name Status   Mother  Alive   Father  Alive   Brother  Alive   Pat Aunt  (Not Specified)   MGM  (Not Specified)   MGF  (Not Specified)   PGF  (Not Specified)   Neg Hx  (Not Specified)   Family  History  Problem Relation Age of Onset   Thyroid disease Father    Brain cancer Paternal Aunt    Cancer Maternal Grandmother    Breast cancer Maternal Grandmother    Diabetes Maternal Grandfather    Colon cancer Maternal Grandfather    Diabetes Paternal Grandfather    Heart disease Paternal Grandfather    Hyperlipidemia Paternal Grandfather    Hypertension Neg Hx    CVA Neg Hx    Allergies  Allergen Reactions   Hydrocodone-Acetaminophen Rash    Patient Care Team: Lexine Baton as PCP - [facility]   Medications: Outpatient Medications Prior to Visit  Medication Sig   metFORMIN (GLUCOPHAGE) 1000 MG tablet Take 1,000 mg by mouth 2 (two) times daily with a meal. For pcos   [DISCONTINUED] buPROPion (WELLBUTRIN) 75 MG tablet Take 1 tablet (75 mg total) by mouth 2 (two) times daily.    No facility-administered medications prior to visit.    Review of Systems  Constitutional:  Negative for activity change and fever.  HENT:  Negative for congestion, ear pain and voice change.   Eyes:  Negative for redness.  Respiratory:  Negative for cough.   Cardiovascular:  Negative for chest pain.  Gastrointestinal:  Negative for constipation and diarrhea.  Endocrine: Negative for polyuria.  Genitourinary:  Negative for flank pain.  Musculoskeletal:  Negative for gait problem and neck stiffness.  Skin:  Negative for color change and rash.  Neurological:  Negative for dizziness.  Hematological:  Negative for adenopathy.  Psychiatric/Behavioral:  Positive for dysphoric mood. Negative for agitation, behavioral problems and confusion.    Last CBC Lab Results  Component Value Date   WBC 11.9 (H) 09/17/2021   HGB 12.4 09/17/2021   HCT 37.4 09/17/2021   MCV 84 09/17/2021   MCH 27.9 09/17/2021   RDW 13.0 09/17/2021   PLT 333 09/17/2021   Last metabolic panel Lab Results  Component Value Date   GLUCOSE 98 11/24/2021   NA 136 11/24/2021   K 3.8 11/24/2021   CL 102 11/24/2021   CO2 20 11/24/2021   BUN 7 11/24/2021   CREATININE 0.65 11/24/2021   EGFR 121 11/24/2021   CALCIUM 9.2 11/24/2021   PROT 6.7 11/24/2021   ALBUMIN 3.9 11/24/2021   LABGLOB 2.8 11/24/2021   AGRATIO 1.4 11/24/2021   BILITOT <0.2 11/24/2021   ALKPHOS 95 11/24/2021   AST 12 11/24/2021   ALT 9 11/24/2021   ANIONGAP 12 11/19/2019   Last lipids Lab Results  Component Value Date   CHOL 213 (H) 11/24/2021   HDL 80 11/24/2021   LDLCALC 100 (H) 11/24/2021   TRIG 198 (H) 11/24/2021   CHOLHDL 2.7 11/24/2021   Last hemoglobin A1c No results found for: HGBA1C Last thyroid functions Lab Results  Component Value Date   TSH 1.020 11/24/2021   T4TOTAL 12.3 (H) 11/24/2021   Last vitamin D No results found for: 25OHVITD2, 25OHVITD3, VD25OH Last vitamin B12 and Folate Lab Results  Component Value  Date   VITAMINB12 145 (L) 09/10/2019   FOLATE 30.6 09/10/2019      The ASCVD Risk score (Arnett DK, et al., 2019) failed to calculate for the following reasons:   The 2019 ASCVD risk score is only valid for ages 74 to 47   Objective    BP 110/70   Pulse 99   Ht 5\' 4"  (1.626 m)   Wt 210 lb 12.8 oz (95.6 kg)   SpO2 99%  BMI 36.18 kg/m       Physical Exam Vitals and nursing note reviewed.  Constitutional:      General: She is not in acute distress.    Appearance: Normal appearance. She is not ill-appearing.  HENT:     Head: Normocephalic and atraumatic.     Right Ear: Tympanic membrane, ear canal and external ear normal.     Left Ear: Tympanic membrane, ear canal and external ear normal.     Nose: No congestion.  Eyes:     Extraocular Movements: Extraocular movements intact.     Conjunctiva/sclera: Conjunctivae normal.     Pupils: Pupils are equal, round, and reactive to light.  Neck:     Vascular: No carotid bruit.  Cardiovascular:     Rate and Rhythm: Normal rate and regular rhythm.     Pulses: Normal pulses.     Heart sounds: Normal heart sounds.  Pulmonary:     Effort: Pulmonary effort is normal.     Breath sounds: Normal breath sounds. No wheezing.  Abdominal:     General: Bowel sounds are normal.     Palpations: Abdomen is soft.  Musculoskeletal:        General: Normal range of motion.     Cervical back: Normal range of motion and neck supple.     Right lower leg: No edema.     Left lower leg: No edema.  Skin:    General: Skin is warm and dry.     Findings: No bruising.  Neurological:     General: No focal deficit present.     Mental Status: She is alert and oriented to person, place, and time.  Psychiatric:        Mood and Affect: Mood normal.        Behavior: Behavior normal.        Thought Content: Thought content normal.      Last depression screening scores    11/24/2021    3:16 PM 09/17/2021    3:42 PM 07/28/2021    1:19 PM  PHQ 2/9 Scores   PHQ - 2 Score 0 0 0   Last fall risk screening    11/24/2021    3:16 PM  Fall Risk   Falls in the past year? 0  Number falls in past yr: 0  Injury with Fall? 0  Risk for fall due to : No Fall Risks  Follow up Falls evaluation completed     Results for orders placed or performed in visit on 11/24/21  Lipid panel  Result Value Ref Range   Cholesterol, Total 213 (H) 100 - 199 mg/dL   Triglycerides 198 (H) 0 - 149 mg/dL   HDL 80 >39 mg/dL   VLDL Cholesterol Cal 33 5 - 40 mg/dL   LDL Chol Calc (NIH) 100 (H) 0 - 99 mg/dL   Chol/HDL Ratio 2.7 0.0 - 4.4 ratio  Thyroid Panel With TSH  Result Value Ref Range   TSH 1.020 0.450 - 4.500 uIU/mL   T4, Total 12.3 (H) 4.5 - 12.0 ug/dL   T3 Uptake Ratio 12 (L) 24 - 39 %   Free Thyroxine Index 1.5 1.2 - 4.9  Comprehensive metabolic panel  Result Value Ref Range   Glucose 98 70 - 99 mg/dL   BUN 7 6 - 20 mg/dL   Creatinine, Ser 0.65 0.57 - 1.00 mg/dL   eGFR 121 >59 mL/min/1.73   BUN/Creatinine Ratio 11 9 - 23   Sodium 136 134 -  144 mmol/L   Potassium 3.8 3.5 - 5.2 mmol/L   Chloride 102 96 - 106 mmol/L   CO2 20 20 - 29 mmol/L   Calcium 9.2 8.7 - 10.2 mg/dL   Total Protein 6.7 6.0 - 8.5 g/dL   Albumin 3.9 3.9 - 5.0 g/dL   Globulin, Total 2.8 1.5 - 4.5 g/dL   Albumin/Globulin Ratio 1.4 1.2 - 2.2   Bilirubin Total <0.2 0.0 - 1.2 mg/dL   Alkaline Phosphatase 95 44 - 121 IU/L   AST 12 0 - 40 IU/L   ALT 9 0 - 32 IU/L    Assessment & Plan    Routine Health Maintenance and Physical Exam  Exercise Activities and Dietary recommendations  Goals   None     Immunization History  Administered Date(s) Administered   DTaP 01/15/1992, 03/13/1992, 05/16/1992, 03/03/1993, 12/30/1995   HPV Quadrivalent 11/17/2006, 01/17/2007, 07/11/2007   Hepatitis B 11/10/1991, 12/17/1991, 05/16/1992   HiB (PRP-OMP) 01/15/1992, 03/13/1992, 05/16/1992, 03/03/1993   IPV 01/15/1992, 03/13/1992, 10/14/1992, 12/30/1995   Influenza Whole 04/10/2008    Influenza-Unspecified 04/12/2019   MMR 03/03/1993, 12/30/1995   PFIZER(Purple Top)SARS-COV-2 Vaccination 12/01/2019, 12/22/2019   Tdap 07/11/2007, 01/20/2018, 07/25/2019    Health Maintenance  Topic Date Due   COVID-19 Vaccine (3 - Booster for [place] series) 05/31/2022 (Originally 02/16/2020)   PAP SMEAR-Modifier  11/22/2022 (Originally 01/05/2021)   Hepatitis C Screening  11/25/2022 (Originally 11/08/2009)   INFLUENZA VACCINE  02/09/2022   TETANUS/TDAP  07/24/2029   HPV VACCINES  Completed   HIV Screening  Completed    Discussed health benefits of physical activity, and encouraged her to engage in regular exercise appropriate for her age and condition.  Problem List Items Addressed This Visit       Endocrine   Polycystic ovaries    Stable, followed by Gyn       Relevant Orders   Thyroid Panel With TSH (Completed)   Comprehensive metabolic panel (Completed)     Other   Family history of hyperlipidemia    eat a low fat diet, increase fiber intake (Benefiber or Metamucil, Cherrios,  oatmeal, beans, nuts, fruits and vegetables), limit saturated fats (in fried foods, red meat), can add OTC fish oil supplement, eat fish with Omega-3 fatty acids like salmon and tuna, exercise for 30 minutes 3 - 5 times a week, drink 8 - 10 glasses of water a day        Relevant Orders   Lipid panel (Completed)   Anxiety    Stable, increase to wellbutrin 100 mg sr daily       Relevant Medications   buPROPion ER (WELLBUTRIN SR) 100 MG 12 hr tablet   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     Irene Pap, PA-C

## 2021-11-24 NOTE — Patient Instructions (Signed)

## 2021-11-25 ENCOUNTER — Encounter: Payer: Self-pay | Admitting: Physician Assistant

## 2021-11-25 LAB — COMPREHENSIVE METABOLIC PANEL
ALT: 9 IU/L (ref 0–32)
AST: 12 IU/L (ref 0–40)
Albumin/Globulin Ratio: 1.4 (ref 1.2–2.2)
Albumin: 3.9 g/dL (ref 3.9–5.0)
Alkaline Phosphatase: 95 IU/L (ref 44–121)
BUN/Creatinine Ratio: 11 (ref 9–23)
BUN: 7 mg/dL (ref 6–20)
Bilirubin Total: <0.2 mg/dL (ref 0.0–1.2)
CO2: 20 mmol/L (ref 20–29)
Calcium: 9.2 mg/dL (ref 8.7–10.2)
Chloride: 102 mmol/L (ref 96–106)
Creatinine, Ser: 0.65 mg/dL (ref 0.57–1.00)
Globulin, Total: 2.8 g/dL (ref 1.5–4.5)
Glucose: 98 mg/dL (ref 70–99)
Potassium: 3.8 mmol/L (ref 3.5–5.2)
Sodium: 136 mmol/L (ref 134–144)
Total Protein: 6.7 g/dL (ref 6.0–8.5)
eGFR: 121 mL/min/{1.73_m2} (ref >59)

## 2021-11-25 LAB — LIPID PANEL
Chol/HDL Ratio: 2.7 ratio (ref 0.0–4.4)
Cholesterol, Total: 213 mg/dL — ABNORMAL HIGH (ref 100–199)
HDL: 80 mg/dL (ref >39)
LDL Chol Calc (NIH): 100 mg/dL — ABNORMAL HIGH (ref 0–99)
Triglycerides: 198 mg/dL — ABNORMAL HIGH (ref 0–149)
VLDL Cholesterol Cal: 33 mg/dL (ref 5–40)

## 2021-11-25 LAB — THYROID PANEL WITH TSH
Free Thyroxine Index: 1.5 (ref 1.2–4.9)
T3 Uptake Ratio: 12 % — ABNORMAL LOW (ref 24–39)
T4, Total: 12.3 ug/dL — ABNORMAL HIGH (ref 4.5–12.0)
TSH: 1.02 u[IU]/mL (ref 0.450–4.500)

## 2021-11-26 DIAGNOSIS — E66812 Obesity, class 2: Secondary | ICD-10-CM | POA: Insufficient documentation

## 2021-11-26 DIAGNOSIS — Z6836 Body mass index (BMI) 36.0-36.9, adult: Secondary | ICD-10-CM | POA: Insufficient documentation

## 2021-11-26 NOTE — Assessment & Plan Note (Signed)
eat a low fat diet, increase fiber intake (Benefiber or Metamucil, Cherrios,  oatmeal, beans, nuts, fruits and vegetables), limit saturated fats (in fried foods, red meat), can add OTC fish oil supplement, eat fish with Omega-3 fatty acids like salmon and tuna, exercise for 30 minutes 3 - 5 times a week, drink 8 - 10 glasses of water a day   

## 2021-11-26 NOTE — Assessment & Plan Note (Signed)
Stable, followed by [facility]

## 2021-11-26 NOTE — Assessment & Plan Note (Signed)
stable, will monitor

## 2021-11-26 NOTE — Assessment & Plan Note (Signed)
Stable, increase to wellbutrin 100 mg sr daily

## 2021-12-02 ENCOUNTER — Encounter: Payer: Self-pay | Admitting: Physician Assistant

## 2021-12-04 ENCOUNTER — Encounter: Payer: Self-pay | Admitting: Physician Assistant

## 2021-12-04 DIAGNOSIS — H1033 Unspecified acute conjunctivitis, bilateral: Secondary | ICD-10-CM

## 2021-12-04 MED ORDER — NEOMYCIN-POLYMYXIN-HC 3.5-10000-1 OP SUSP: 3.0000 [drp] | Freq: Four times a day (QID) | OPHTHALMIC | 0 refills | Status: AC

## 2021-12-04 NOTE — Telephone Encounter (Signed)
Antibiotic eye drops sent to pharmacy for her. Thanks.

## 2021-12-08 ENCOUNTER — Encounter: Payer: Self-pay | Admitting: Physician Assistant

## 2021-12-08 DIAGNOSIS — Z3201 Encounter for pregnancy test, result positive: Secondary | ICD-10-CM | POA: Diagnosis not present

## 2021-12-10 DIAGNOSIS — Z3A22 22 weeks gestation of pregnancy: Secondary | ICD-10-CM | POA: Diagnosis not present

## 2021-12-10 DIAGNOSIS — O3680X0 Pregnancy with inconclusive fetal viability, not applicable or unspecified: Secondary | ICD-10-CM | POA: Diagnosis not present

## 2021-12-16 DIAGNOSIS — Z3A23 23 weeks gestation of pregnancy: Secondary | ICD-10-CM | POA: Diagnosis not present

## 2021-12-16 DIAGNOSIS — O09812 Supervision of pregnancy resulting from assisted reproductive technology, second trimester: Secondary | ICD-10-CM | POA: Diagnosis not present

## 2021-12-16 DIAGNOSIS — Z124 Encounter for screening for malignant neoplasm of cervix: Secondary | ICD-10-CM | POA: Diagnosis not present

## 2021-12-16 DIAGNOSIS — Z369 Encounter for antenatal screening, unspecified: Secondary | ICD-10-CM | POA: Diagnosis not present

## 2021-12-16 DIAGNOSIS — Z363 Encounter for antenatal screening for malformations: Secondary | ICD-10-CM | POA: Diagnosis not present

## 2021-12-16 DIAGNOSIS — O34211 Maternal care for low transverse scar from previous cesarean delivery: Secondary | ICD-10-CM | POA: Diagnosis not present

## 2021-12-16 DIAGNOSIS — Z3482 Encounter for supervision of other normal pregnancy, second trimester: Secondary | ICD-10-CM | POA: Diagnosis not present

## 2021-12-16 DIAGNOSIS — Z113 Encounter for screening for infections with a predominantly sexual mode of transmission: Secondary | ICD-10-CM | POA: Diagnosis not present

## 2021-12-16 DIAGNOSIS — Z1151 Encounter for screening for human papillomavirus (HPV): Secondary | ICD-10-CM | POA: Diagnosis not present

## 2021-12-16 LAB — OB RESULTS CONSOLE HEPATITIS B SURFACE ANTIGEN: Hepatitis B Surface Ag: NEGATIVE

## 2021-12-16 LAB — OB RESULTS CONSOLE GC/CHLAMYDIA
Chlamydia: NEGATIVE
Neisseria Gonorrhea: NEGATIVE

## 2021-12-16 LAB — HM PAP SMEAR: HM Pap smear: NEGATIVE

## 2021-12-16 LAB — OB RESULTS CONSOLE ABO/RH: RH Type: POSITIVE

## 2021-12-16 LAB — HEPATITIS C ANTIBODY: HCV Ab: NEGATIVE

## 2021-12-16 LAB — OB RESULTS CONSOLE HIV ANTIBODY (ROUTINE TESTING): HIV: NONREACTIVE

## 2021-12-16 LAB — OB RESULTS CONSOLE RPR: RPR: NONREACTIVE

## 2021-12-16 LAB — OB RESULTS CONSOLE ANTIBODY SCREEN: Antibody Screen: NEGATIVE

## 2021-12-16 LAB — RESULTS CONSOLE HPV: CHL HPV: NEGATIVE

## 2021-12-16 LAB — OB RESULTS CONSOLE RUBELLA ANTIBODY, IGM: Rubella: IMMUNE

## 2022-01-08 ENCOUNTER — Encounter: Payer: Self-pay | Admitting: Internal Medicine

## 2022-01-15 ENCOUNTER — Encounter: Payer: Self-pay | Admitting: Physician Assistant

## 2022-01-22 DIAGNOSIS — Z3689 Encounter for other specified antenatal screening: Secondary | ICD-10-CM | POA: Diagnosis not present

## 2022-01-22 DIAGNOSIS — Z23 Encounter for immunization: Secondary | ICD-10-CM | POA: Diagnosis not present

## 2022-01-22 DIAGNOSIS — O0933 Supervision of pregnancy with insufficient antenatal care, third trimester: Secondary | ICD-10-CM | POA: Diagnosis not present

## 2022-01-22 DIAGNOSIS — Z3A29 29 weeks gestation of pregnancy: Secondary | ICD-10-CM | POA: Diagnosis not present

## 2022-01-22 DIAGNOSIS — O34211 Maternal care for low transverse scar from previous cesarean delivery: Secondary | ICD-10-CM | POA: Diagnosis not present

## 2022-01-27 DIAGNOSIS — R7309 Other abnormal glucose: Secondary | ICD-10-CM | POA: Diagnosis not present

## 2022-02-24 ENCOUNTER — Inpatient Hospital Stay (HOSPITAL_COMMUNITY)
Admission: AD | Admit: 2022-02-24 | Discharge: 2022-02-24 | Discharge status: Against Medical Advice | Payer: BC Managed Care – PPO | Attending: Obstetrics | Admitting: Obstetrics

## 2022-02-24 ENCOUNTER — Encounter (HOSPITAL_COMMUNITY): Payer: Self-pay | Admitting: *Deleted

## 2022-02-24 DIAGNOSIS — M7989 Other specified soft tissue disorders: Secondary | ICD-10-CM | POA: Diagnosis not present

## 2022-02-24 DIAGNOSIS — Z3A33 33 weeks gestation of pregnancy: Secondary | ICD-10-CM | POA: Diagnosis not present

## 2022-02-24 DIAGNOSIS — O99353 Diseases of the nervous system complicating pregnancy, third trimester: Secondary | ICD-10-CM | POA: Diagnosis not present

## 2022-02-24 DIAGNOSIS — R519 Headache, unspecified: Secondary | ICD-10-CM | POA: Diagnosis not present

## 2022-02-24 DIAGNOSIS — O26893 Other specified pregnancy related conditions, third trimester: Secondary | ICD-10-CM | POA: Insufficient documentation

## 2022-02-24 HISTORY — DX: Anxiety disorder, unspecified: F41.9

## 2022-02-24 HISTORY — DX: Female infertility, unspecified: N97.9

## 2022-02-24 LAB — PROTEIN / CREATININE RATIO, URINE
Creatinine, Urine: 44 mg/dL
Total Protein, Urine: <6 mg/dL

## 2022-02-24 LAB — URINALYSIS, ROUTINE W REFLEX MICROSCOPIC
Bilirubin Urine: NEGATIVE
Glucose, UA: NEGATIVE mg/dL
Hgb urine dipstick: NEGATIVE
Ketones, ur: NEGATIVE mg/dL
Nitrite: NEGATIVE
Protein, ur: NEGATIVE mg/dL
Specific Gravity, Urine: 1.005 (ref 1.005–1.030)
pH: 7 (ref 5.0–8.0)

## 2022-02-24 MED ORDER — LACTATED RINGERS IV BOLUS: 1000.0000 mL | Freq: Once | INTRAVENOUS | Status: DC

## 2022-02-24 MED ORDER — ACETAMINOPHEN-CAFFEINE 500-65 MG PO TABS
2.0000 | ORAL_TABLET | Freq: Once | ORAL | Status: AC
  Administered 2022-02-24: 2 via ORAL
  Filled 2022-02-24: qty 2

## 2022-02-24 NOTE — MAU Provider Note (Signed)
History     CSN: [id number]  Arrival date and time: 02/24/22 1624   Event Date/Time   First Provider Initiated Contact with Patient 02/24/22 1750      Chief Complaint  Patient presents with   Headache   Foot Swelling   Blurred Vision   HPI Ms. Sydney Nichols is a 31 y.o. year old G35P1001 female at [redacted]w[redacted]d weeks gestation who presents to MAU reporting swollen feet for the last couple of days. She reports a mild H/A that started at 1000 this morning. She did not take anything for the H/A and it was not bad enough to cause her to leave from work early. She has been seeing floaters in her vision since 1500 today. She also report pelvic pressure x 3 wks.   OB History     Gravida  2   Para  1   Term  1   Preterm      AB      Living  1      SAB      IAB      Ectopic      Multiple  0   Live Births  1        Obstetric Comments  IND for BP, c/s for FTP         Past Medical History:  Diagnosis Date   Acne    Allergy    RHINITIS   Anemia    iron iv  x feb 2022,   Anxiety    Eczema    Elevated heart rate with elevated blood pressure without diagnosis of hypertension    seeing dr balan ? graves disease   Endometriosis    History of blood transfusion 09/2019   after c section   History of gestational hypertension 09/2019   resolved   Infertility, female    first preg IVF   Medical history non-contributory    Normal postpartum course 09/22/2019   PCOS (polycystic ovarian syndrome)    Pregnant 09/21/2019   S/P cesarean section 09/22/2019   Thyrotoxicosis dx 2022   without thryoid storm lov dr Talmage Nap 09-24-2020 endocrinology on chart   Wears glasses     Past Surgical History:  Procedure Laterality Date   CESAREAN SECTION N/A 09/22/2019   Procedure: CESAREAN SECTION;  Surgeon: Sherian Rein, MD;  Location: [facility];  Service: Obstetrics;  Laterality: N/A;   CHOLECYSTECTOMY  11/2019   laparoscopic   DILATATION & CURETTAGE/HYSTEROSCOPY WITH  MYOSURE N/A 10/16/2020   Procedure: DILATATION & CURETTAGE/HYSTEROSCOPY WITH MYOSURE;  Surgeon: Sherian Rein, MD;  Location: [facility];  Service: Gynecology;  Laterality: N/A;   egg retrieval  12/2018    Family History  Problem Relation Age of Onset   Healthy Mother    Heart disease Father    Thyroid disease Father    Hyperthyroidism Father    Brain cancer Paternal Aunt    Cancer Maternal Grandmother    Breast cancer Maternal Grandmother    Diabetes Maternal Grandfather    Colon cancer Maternal Grandfather    Diabetes Paternal Grandfather    Heart disease Paternal Grandfather    Hyperlipidemia Paternal Grandfather    Hypertension Neg Hx    CVA Neg Hx     Social History   Tobacco Use   Smoking status: Never   Smokeless tobacco: Never  Vaping Use   Vaping Use: Never used  Substance Use Topics   Alcohol use: No   Drug use: No  Allergies:  Allergies  Allergen Reactions   Hydrocodone-Acetaminophen Rash    Makes her itchy    No medications prior to admission.    Review of Systems  HENT: Negative.    Eyes: Negative.   Respiratory: Negative.    Cardiovascular: Negative.   Gastrointestinal: Negative.   Endocrine: Negative.   Genitourinary:  Positive for pelvic pain (pelvic pressure).  Musculoskeletal: Negative.   Skin: Negative.   Allergic/Immunologic: Negative.   Neurological:  Positive for headaches ("mild").  Hematological: Negative.   Psychiatric/Behavioral: Negative.     Physical Exam   Blood pressure 110/66, pulse 94, temperature 98 F (36.7 C), temperature source Oral, resp. rate 18, height 5\' 4"  (1.626 m), weight 102.2 kg, SpO2 99 %, unknown if currently breastfeeding.  Physical Exam Vitals and nursing note reviewed.  Constitutional:      Appearance: Normal appearance. She is obese.  Cardiovascular:     Rate and Rhythm: Normal rate.     Pulses: Normal pulses.  Pulmonary:     Effort: Pulmonary effort is normal.   Genitourinary:    Comments: Not indicated Musculoskeletal:        General: Normal range of motion.     Right lower leg: No edema (trace edema c/w third trimester pregnancy).     Left lower leg: No edema (trace edema c/w third trimester pregnancy).  Skin:    General: Skin is warm and dry.  Neurological:     Mental Status: She is alert and oriented to person, place, and time.  Psychiatric:        Mood and Affect: Mood normal.        Behavior: Behavior normal.        Thought Content: Thought content normal.        Judgment: Judgment normal.   Reassessment @ 1850: Patient's medications, IV and PEC labs not done. Patient reporting that she needs to pick up her son. She does not want to wait for lab to be drawn or resulted. Patient willing to take Excedrin but not willing to wait for labs to be drawn. Patient left immediately after taking Excedrin   REACTIVE NST - FHR: 130 bpm / moderate variability / accels present / decels absent / TOCO: UI Noted   MAU Course  Procedures  MDM CCUA Excedrin Tension H/A 2 tablets CBC -- pt declined to wait for labs CMP -- pt declined to wait for labs P/C Ratio  Results for orders placed or performed during the hospital encounter of 02/24/22 (from the past 24 hour(s))  Urinalysis, Routine w reflex microscopic     Status: Abnormal   Collection Time: 02/24/22  5:10 PM  Result Value Ref Range   Color, Urine YELLOW YELLOW   APPearance HAZY (A) CLEAR   Specific Gravity, Urine 1.005 1.005 - 1.030   pH 7.0 5.0 - 8.0   Glucose, UA NEGATIVE NEGATIVE mg/dL   Hgb urine dipstick NEGATIVE NEGATIVE   Bilirubin Urine NEGATIVE NEGATIVE   Ketones, ur NEGATIVE NEGATIVE mg/dL   Protein, ur NEGATIVE NEGATIVE mg/dL   Nitrite NEGATIVE NEGATIVE   Leukocytes,Ua SMALL (A) NEGATIVE   RBC / HPF 0-5 0 - 5 RBC/hpf   WBC, UA 0-5 0 - 5 WBC/hpf   Bacteria, UA MANY (A) NONE SEEN   Squamous Epithelial / LPF 11-20 0 - 5  Protein / creatinine ratio, urine     Status: None    Collection Time: 02/24/22  5:10 PM  Result Value Ref Range   Creatinine, Urine  44 mg/dL   Total Protein, Urine <6 mg/dL   Protein Creatinine Ratio Below reportable range    0.00 - 0.15 mg/mg[Cre]    Assessment and Plan  1. Pregnancy headache in third trimester - Discharge patient - Treated with Excedrin Tension Headache prior to leaving MAU - Reassurance given that BPs are WNL and the trace swelling she has is normal for this stage in pregnancy - Advised to return for worsening sx's - Keep scheduled appt on 03/09/22 with [facility]  2. [redacted] weeks gestation of pregnancy    Raelyn Mora, [place] 02/24/2022, 5:50 PM

## 2022-02-24 NOTE — MAU Note (Signed)
Sydney Nichols is a 31 y.o. at [redacted]w[redacted]d here in MAU reporting: feet has been swollen the last couple days. Got a mild headache today (did not take anything), part of her vision seems fuzzy. Pelvic pressure, ongoing the past 3 wks. No bleeding or LOF.  Reports +FM.  Denies elevated BP with this pregnancy.   Onset of complaint: earlier today, swelling for 2 days Pain score: 4- HA Vitals:   02/24/22 1653  BP: 112/72  Pulse: 97  Resp: 18  Temp: 98 F (36.7 C)  SpO2: 100%     GOT:LXBW dress on , report +FM Lab orders placed from triage:

## 2022-03-09 DIAGNOSIS — Z3A35 35 weeks gestation of pregnancy: Secondary | ICD-10-CM | POA: Diagnosis not present

## 2022-03-17 ENCOUNTER — Encounter: Payer: Self-pay | Admitting: Internal Medicine

## 2022-03-17 DIAGNOSIS — O133 Gestational [pregnancy-induced] hypertension without significant proteinuria, third trimester: Secondary | ICD-10-CM | POA: Diagnosis not present

## 2022-03-17 DIAGNOSIS — Z3685 Encounter for antenatal screening for Streptococcus B: Secondary | ICD-10-CM | POA: Diagnosis not present

## 2022-03-17 DIAGNOSIS — Z3A36 36 weeks gestation of pregnancy: Secondary | ICD-10-CM | POA: Diagnosis not present

## 2022-03-17 LAB — OB RESULTS CONSOLE GBS: GBS: NEGATIVE

## 2022-03-22 NOTE — Patient Instructions (Addendum)
Sydney Nichols  03/22/2022   Your procedure is scheduled on:  04/02/2022  Arrive at 0800 at Entrance C on [address] at [facility]  and [address]. You are invited to use the FREE valet parking or use the Visitor's parking deck.  Pick up the phone at the desk and dial [phone].  Call this number if you have problems the morning of surgery: [phone]  Remember:   Do not eat food:(After Midnight) Desps de medianoche.  Do not drink clear liquids: (After Midnight) Desps de medianoche.  Take these medicines the morning of surgery with A SIP OF WATER:  none   Do not wear jewelry, make-up or nail polish.  Do not wear lotions, powders, or perfumes. Do not wear deodorant.  Do not shave 48 hours prior to surgery.  Do not bring valuables to the hospital.  [facility] is not   responsible for any belongings or valuables brought to the hospital.  Contacts, dentures or bridgework may not be worn into surgery.  Leave suitcase in the car. After surgery it may be brought to your room.  For patients admitted to the hospital, checkout time is 11:00 AM the day of              discharge.      Please read over the following fact sheets that you were given:     Preparing for Surgery

## 2022-03-24 ENCOUNTER — Encounter (HOSPITAL_COMMUNITY): Payer: Self-pay

## 2022-03-24 ENCOUNTER — Telehealth (HOSPITAL_COMMUNITY): Payer: Self-pay | Admitting: *Deleted

## 2022-03-24 NOTE — Telephone Encounter (Signed)
Preadmission screen  

## 2022-03-25 ENCOUNTER — Encounter (HOSPITAL_COMMUNITY): Payer: Self-pay

## 2022-03-25 DIAGNOSIS — Z3A37 37 weeks gestation of pregnancy: Secondary | ICD-10-CM | POA: Diagnosis not present

## 2022-03-25 DIAGNOSIS — O133 Gestational [pregnancy-induced] hypertension without significant proteinuria, third trimester: Secondary | ICD-10-CM | POA: Diagnosis not present

## 2022-03-31 ENCOUNTER — Encounter (HOSPITAL_COMMUNITY)
Admission: RE | Admit: 2022-03-31 | Discharge: 2022-03-31 | Disposition: A | Discharge status: Home / Self Care | Payer: BC Managed Care – PPO | Source: Ambulatory Visit | Attending: Obstetrics & Gynecology | Admitting: Obstetrics & Gynecology

## 2022-03-31 DIAGNOSIS — Z3A00 Weeks of gestation of pregnancy not specified: Secondary | ICD-10-CM | POA: Insufficient documentation

## 2022-03-31 DIAGNOSIS — Z98891 History of uterine scar from previous surgery: Secondary | ICD-10-CM | POA: Diagnosis not present

## 2022-03-31 DIAGNOSIS — O34211 Maternal care for low transverse scar from previous cesarean delivery: Secondary | ICD-10-CM | POA: Diagnosis not present

## 2022-03-31 DIAGNOSIS — D62 Acute posthemorrhagic anemia: Secondary | ICD-10-CM | POA: Diagnosis not present

## 2022-03-31 DIAGNOSIS — O9902 Anemia complicating childbirth: Secondary | ICD-10-CM | POA: Diagnosis not present

## 2022-03-31 DIAGNOSIS — D649 Anemia, unspecified: Secondary | ICD-10-CM | POA: Diagnosis not present

## 2022-03-31 DIAGNOSIS — O9081 Anemia of the puerperium: Secondary | ICD-10-CM | POA: Diagnosis not present

## 2022-03-31 DIAGNOSIS — Z3A39 39 weeks gestation of pregnancy: Secondary | ICD-10-CM | POA: Diagnosis not present

## 2022-03-31 LAB — CBC
HCT: 31 % — ABNORMAL LOW (ref 36.0–46.0)
Hemoglobin: 9.7 g/dL — ABNORMAL LOW (ref 12.0–15.0)
MCH: 22.9 pg — ABNORMAL LOW (ref 26.0–34.0)
MCHC: 31.3 g/dL (ref 30.0–36.0)
MCV: 73.3 fL — ABNORMAL LOW (ref 80.0–100.0)
Platelets: 324 10*3/uL (ref 150–400)
RBC: 4.23 MIL/uL (ref 3.87–5.11)
RDW: 15.9 % — ABNORMAL HIGH (ref 11.5–15.5)
WBC: 12 10*3/uL — ABNORMAL HIGH (ref 4.0–10.5)
nRBC: 0 % (ref 0.0–0.2)

## 2022-03-31 LAB — RPR: RPR Ser Ql: NONREACTIVE

## 2022-03-31 LAB — TYPE AND SCREEN
ABO/RH(D): O POS
Antibody Screen: NEGATIVE

## 2022-04-02 ENCOUNTER — Other Ambulatory Visit: Payer: Self-pay

## 2022-04-02 ENCOUNTER — Encounter (HOSPITAL_COMMUNITY): Payer: Self-pay | Admitting: Obstetrics and Gynecology

## 2022-04-02 ENCOUNTER — Inpatient Hospital Stay (HOSPITAL_COMMUNITY): Payer: BC Managed Care – PPO | Admitting: Anesthesiology

## 2022-04-02 ENCOUNTER — Encounter (HOSPITAL_COMMUNITY)
Admission: RE | Disposition: A | Discharge status: Home / Self Care | Payer: Self-pay | Source: Home / Self Care | Attending: Obstetrics and Gynecology

## 2022-04-02 ENCOUNTER — Inpatient Hospital Stay (HOSPITAL_COMMUNITY)
Admission: RE | Admit: 2022-04-02 | Discharge: 2022-04-05 | DRG: 787 | Disposition: A | Discharge status: Home / Self Care | Payer: BC Managed Care – PPO | Attending: Obstetrics and Gynecology | Admitting: Obstetrics and Gynecology

## 2022-04-02 DIAGNOSIS — O9081 Anemia of the puerperium: Secondary | ICD-10-CM | POA: Diagnosis not present

## 2022-04-02 DIAGNOSIS — Z3A39 39 weeks gestation of pregnancy: Secondary | ICD-10-CM

## 2022-04-02 DIAGNOSIS — D62 Acute posthemorrhagic anemia: Secondary | ICD-10-CM | POA: Diagnosis not present

## 2022-04-02 DIAGNOSIS — Z98891 History of uterine scar from previous surgery: Secondary | ICD-10-CM

## 2022-04-02 DIAGNOSIS — O34211 Maternal care for low transverse scar from previous cesarean delivery: Principal | ICD-10-CM | POA: Diagnosis present

## 2022-04-02 SURGERY — Surgical Case
Anesthesia: Spinal

## 2022-04-02 MED ORDER — FENTANYL CITRATE (PF) 100 MCG/2ML IJ SOLN
INTRAMUSCULAR | Status: AC
  Filled 2022-04-02: qty 2

## 2022-04-02 MED ORDER — KETOROLAC TROMETHAMINE 30 MG/ML IJ SOLN
INTRAMUSCULAR | Status: AC
  Filled 2022-04-02: qty 1

## 2022-04-02 MED ORDER — SCOPOLAMINE 1 MG/3DAYS TD PT72
MEDICATED_PATCH | TRANSDERMAL | Status: AC
  Filled 2022-04-02: qty 1

## 2022-04-02 MED ORDER — DIPHENHYDRAMINE HCL 25 MG PO CAPS
25.0000 mg | ORAL_CAPSULE | ORAL | Status: DC | PRN
  Administered 2022-04-03: 25 mg via ORAL
  Filled 2022-04-02: qty 1

## 2022-04-02 MED ORDER — STERILE WATER FOR IRRIGATION IR SOLN
Status: DC | PRN
  Administered 2022-04-02: 1

## 2022-04-02 MED ORDER — CEFAZOLIN SODIUM-DEXTROSE 2-4 GM/100ML-% IV SOLN
2.0000 g | INTRAVENOUS | Status: AC
  Administered 2022-04-02: 2 g via INTRAVENOUS

## 2022-04-02 MED ORDER — SCOPOLAMINE 1 MG/3DAYS TD PT72
1.0000 | MEDICATED_PATCH | Freq: Once | TRANSDERMAL | Status: AC
  Administered 2022-04-02: 1.5 mg via TRANSDERMAL

## 2022-04-02 MED ORDER — SENNOSIDES-DOCUSATE SODIUM 8.6-50 MG PO TABS
2.0000 | ORAL_TABLET | ORAL | Status: DC
  Administered 2022-04-04: 2 via ORAL
  Filled 2022-04-02 (×2): qty 2

## 2022-04-02 MED ORDER — NALOXONE HCL 4 MG/10ML IJ SOLN: 1.0000 ug/kg/h | INTRAVENOUS | Status: DC | PRN

## 2022-04-02 MED ORDER — LACTATED RINGERS IV SOLN: INTRAVENOUS | Status: DC

## 2022-04-02 MED ORDER — ACETAMINOPHEN 500 MG PO TABS
ORAL_TABLET | ORAL | Status: AC
  Filled 2022-04-02: qty 2

## 2022-04-02 MED ORDER — DEXAMETHASONE SODIUM PHOSPHATE 4 MG/ML IJ SOLN
INTRAMUSCULAR | Status: AC
  Filled 2022-04-02: qty 1

## 2022-04-02 MED ORDER — TETANUS-DIPHTH-ACELL PERTUSSIS 5-2.5-18.5 LF-MCG/0.5 IM SUSY: 0.5000 mL | PREFILLED_SYRINGE | Freq: Once | INTRAMUSCULAR | Status: DC

## 2022-04-02 MED ORDER — ENOXAPARIN SODIUM 60 MG/0.6ML IJ SOSY
50.0000 mg | PREFILLED_SYRINGE | INTRAMUSCULAR | Status: DC
  Administered 2022-04-03 – 2022-04-05 (×2): 50 mg via SUBCUTANEOUS
  Filled 2022-04-02 (×3): qty 0.6

## 2022-04-02 MED ORDER — WITCH HAZEL-GLYCERIN EX PADS: 1.0000 | MEDICATED_PAD | CUTANEOUS | Status: DC | PRN

## 2022-04-02 MED ORDER — PHENYLEPHRINE HCL-NACL 20-0.9 MG/250ML-% IV SOLN
INTRAVENOUS | Status: DC | PRN
  Administered 2022-04-02: 60 ug/min via INTRAVENOUS

## 2022-04-02 MED ORDER — DIPHENHYDRAMINE HCL 25 MG PO CAPS: 25.0000 mg | ORAL_CAPSULE | Freq: Four times a day (QID) | ORAL | Status: DC | PRN

## 2022-04-02 MED ORDER — IBUPROFEN 600 MG PO TABS
600.0000 mg | ORAL_TABLET | Freq: Four times a day (QID) | ORAL | Status: DC
  Administered 2022-04-03 – 2022-04-05 (×8): 600 mg via ORAL
  Filled 2022-04-02 (×8): qty 1

## 2022-04-02 MED ORDER — DIBUCAINE (PERIANAL) 1 % EX OINT: 1.0000 | TOPICAL_OINTMENT | CUTANEOUS | Status: DC | PRN

## 2022-04-02 MED ORDER — OXYTOCIN-SODIUM CHLORIDE 30-0.9 UT/500ML-% IV SOLN
INTRAVENOUS | Status: DC | PRN
  Administered 2022-04-02: 300 mL via INTRAVENOUS

## 2022-04-02 MED ORDER — ONDANSETRON HCL 4 MG/2ML IJ SOLN
INTRAMUSCULAR | Status: DC | PRN
  Administered 2022-04-02: 4 mg via INTRAVENOUS

## 2022-04-02 MED ORDER — MEASLES, MUMPS & RUBELLA VAC IJ SOLR: 0.5000 mL | Freq: Once | INTRAMUSCULAR | Status: DC

## 2022-04-02 MED ORDER — CEFAZOLIN SODIUM-DEXTROSE 2-4 GM/100ML-% IV SOLN
INTRAVENOUS | Status: AC
  Filled 2022-04-02: qty 100

## 2022-04-02 MED ORDER — COCONUT OIL OIL: 1.0000 | TOPICAL_OIL | Status: DC | PRN

## 2022-04-02 MED ORDER — DEXAMETHASONE SODIUM PHOSPHATE 10 MG/ML IJ SOLN
INTRAMUSCULAR | Status: DC | PRN
  Administered 2022-04-02: 4 mg via INTRAVENOUS

## 2022-04-02 MED ORDER — EPHEDRINE 5 MG/ML INJ
INTRAVENOUS | Status: AC
  Filled 2022-04-02: qty 5

## 2022-04-02 MED ORDER — MEPERIDINE HCL 25 MG/ML IJ SOLN: 6.2500 mg | INTRAMUSCULAR | Status: DC | PRN

## 2022-04-02 MED ORDER — MENTHOL 3 MG MT LOZG: 1.0000 | LOZENGE | OROMUCOSAL | Status: DC | PRN

## 2022-04-02 MED ORDER — TRANEXAMIC ACID-NACL 1000-0.7 MG/100ML-% IV SOLN
1000.0000 mg | INTRAVENOUS | Status: AC
  Administered 2022-04-02: 1000 mg via INTRAVENOUS

## 2022-04-02 MED ORDER — BUPIVACAINE IN DEXTROSE 0.75-8.25 % IT SOLN
INTRATHECAL | Status: DC | PRN
  Administered 2022-04-02: 1.6 mL via INTRATHECAL

## 2022-04-02 MED ORDER — NALOXONE HCL 0.4 MG/ML IJ SOLN: 0.4000 mg | INTRAMUSCULAR | Status: DC | PRN

## 2022-04-02 MED ORDER — METHYLERGONOVINE MALEATE 0.2 MG/ML IJ SOLN: 0.2000 mg | INTRAMUSCULAR | Status: DC | PRN

## 2022-04-02 MED ORDER — OXYTOCIN-SODIUM CHLORIDE 30-0.9 UT/500ML-% IV SOLN: 2.5000 [IU]/h | INTRAVENOUS | Status: AC

## 2022-04-02 MED ORDER — ONDANSETRON HCL 4 MG/2ML IJ SOLN
INTRAMUSCULAR | Status: AC
  Filled 2022-04-02: qty 2

## 2022-04-02 MED ORDER — OXYTOCIN-SODIUM CHLORIDE 30-0.9 UT/500ML-% IV SOLN
INTRAVENOUS | Status: AC
  Filled 2022-04-02: qty 500

## 2022-04-02 MED ORDER — ACETAMINOPHEN 500 MG PO TABS
1000.0000 mg | ORAL_TABLET | Freq: Four times a day (QID) | ORAL | Status: DC
  Administered 2022-04-02 – 2022-04-05 (×8): 1000 mg via ORAL
  Filled 2022-04-02 (×10): qty 2

## 2022-04-02 MED ORDER — SIMETHICONE 80 MG PO CHEW
80.0000 mg | CHEWABLE_TABLET | ORAL | Status: DC | PRN
  Administered 2022-04-03 – 2022-04-05 (×2): 80 mg via ORAL
  Filled 2022-04-02: qty 1

## 2022-04-02 MED ORDER — MORPHINE SULFATE (PF) 0.5 MG/ML IJ SOLN
INTRAMUSCULAR | Status: AC
  Filled 2022-04-02: qty 10

## 2022-04-02 MED ORDER — ZOLPIDEM TARTRATE 5 MG PO TABS: 5.0000 mg | ORAL_TABLET | Freq: Every evening | ORAL | Status: DC | PRN

## 2022-04-02 MED ORDER — MORPHINE SULFATE (PF) 0.5 MG/ML IJ SOLN
INTRAMUSCULAR | Status: DC | PRN
  Administered 2022-04-02: 150 ug via INTRATHECAL

## 2022-04-02 MED ORDER — SODIUM CHLORIDE 0.9 % IR SOLN
Status: DC | PRN
  Administered 2022-04-02: 1

## 2022-04-02 MED ORDER — SODIUM CHLORIDE 0.9 % IV SOLN
25.0000 mg | Freq: Once | INTRAVENOUS | Status: AC
  Administered 2022-04-02: 25 mg via INTRAVENOUS
  Filled 2022-04-02 (×2): qty 1

## 2022-04-02 MED ORDER — MAGNESIUM HYDROXIDE 400 MG/5ML PO SUSP: 30.0000 mL | ORAL | Status: DC | PRN

## 2022-04-02 MED ORDER — SODIUM CHLORIDE 0.9% FLUSH: 3.0000 mL | INTRAVENOUS | Status: DC | PRN

## 2022-04-02 MED ORDER — KETOROLAC TROMETHAMINE 30 MG/ML IJ SOLN: 30.0000 mg | Freq: Four times a day (QID) | INTRAMUSCULAR | Status: AC | PRN

## 2022-04-02 MED ORDER — TRANEXAMIC ACID-NACL 1000-0.7 MG/100ML-% IV SOLN
INTRAVENOUS | Status: AC
  Filled 2022-04-02: qty 100

## 2022-04-02 MED ORDER — FENTANYL CITRATE (PF) 100 MCG/2ML IJ SOLN
25.0000 ug | INTRAMUSCULAR | Status: DC | PRN
  Administered 2022-04-02 (×2): 50 ug via INTRAVENOUS
  Administered 2022-04-02: 25 ug via INTRAVENOUS

## 2022-04-02 MED ORDER — PRENATAL MULTIVITAMIN CH
1.0000 | ORAL_TABLET | Freq: Every day | ORAL | Status: DC
  Administered 2022-04-03 – 2022-04-04 (×2): 1 via ORAL
  Filled 2022-04-02 (×2): qty 1

## 2022-04-02 MED ORDER — KETOROLAC TROMETHAMINE 30 MG/ML IJ SOLN
30.0000 mg | Freq: Four times a day (QID) | INTRAMUSCULAR | Status: AC
  Administered 2022-04-02 – 2022-04-03 (×3): 30 mg via INTRAVENOUS
  Filled 2022-04-02 (×3): qty 1

## 2022-04-02 MED ORDER — EPHEDRINE SULFATE-NACL 50-0.9 MG/10ML-% IV SOSY
PREFILLED_SYRINGE | INTRAVENOUS | Status: DC | PRN
  Administered 2022-04-02: 5 mg via INTRAVENOUS

## 2022-04-02 MED ORDER — DIPHENHYDRAMINE HCL 50 MG/ML IJ SOLN: 12.5000 mg | INTRAMUSCULAR | Status: DC | PRN

## 2022-04-02 MED ORDER — METHYLERGONOVINE MALEATE 0.2 MG PO TABS: 0.2000 mg | ORAL_TABLET | ORAL | Status: DC | PRN

## 2022-04-02 MED ORDER — ONDANSETRON HCL 4 MG/2ML IJ SOLN: 4.0000 mg | Freq: Three times a day (TID) | INTRAMUSCULAR | Status: DC | PRN

## 2022-04-02 MED ORDER — POVIDONE-IODINE 10 % EX SWAB
2.0000 | Freq: Once | CUTANEOUS | Status: AC
  Administered 2022-04-02: 2 via TOPICAL

## 2022-04-02 MED ORDER — FENTANYL CITRATE (PF) 100 MCG/2ML IJ SOLN
INTRAMUSCULAR | Status: DC | PRN
  Administered 2022-04-02: 15 ug via INTRATHECAL

## 2022-04-02 MED ORDER — OXYCODONE HCL 5 MG PO TABS
5.0000 mg | ORAL_TABLET | ORAL | Status: DC | PRN
  Administered 2022-04-03: 5 mg via ORAL
  Administered 2022-04-04 – 2022-04-05 (×7): 10 mg via ORAL
  Filled 2022-04-02 (×5): qty 2
  Filled 2022-04-02: qty 1
  Filled 2022-04-02 (×2): qty 2

## 2022-04-02 MED ORDER — PHENYLEPHRINE HCL-NACL 20-0.9 MG/250ML-% IV SOLN
INTRAVENOUS | Status: AC
  Filled 2022-04-02: qty 250

## 2022-04-02 MED ORDER — KETOROLAC TROMETHAMINE 30 MG/ML IJ SOLN
30.0000 mg | Freq: Four times a day (QID) | INTRAMUSCULAR | Status: AC | PRN
  Administered 2022-04-02: 30 mg via INTRAMUSCULAR

## 2022-04-02 MED ORDER — ACETAMINOPHEN 500 MG PO TABS
1000.0000 mg | ORAL_TABLET | Freq: Four times a day (QID) | ORAL | Status: AC
  Administered 2022-04-02 – 2022-04-03 (×3): 1000 mg via ORAL
  Filled 2022-04-02: qty 2

## 2022-04-02 SURGICAL SUPPLY — 31 items
APL SKNCLS STERI-STRIP NONHPOA (GAUZE/BANDAGES/DRESSINGS) ×1
BENZOIN TINCTURE PRP APPL 2/3 (GAUZE/BANDAGES/DRESSINGS) IMPLANT
CHLORAPREP W/TINT 26ML (MISCELLANEOUS) ×2 IMPLANT
CLAMP UMBILICAL CORD (MISCELLANEOUS) ×1 IMPLANT
CLOTH BEACON ORANGE TIMEOUT ST (SAFETY) ×1 IMPLANT
DRSG OPSITE POSTOP 4X10 (GAUZE/BANDAGES/DRESSINGS) ×1 IMPLANT
ELECT REM PT RETURN 9FT ADLT (ELECTROSURGICAL) ×1
ELECTRODE REM PT RTRN 9FT ADLT (ELECTROSURGICAL) ×1 IMPLANT
EXTRACTOR VACUUM KIWI (MISCELLANEOUS) IMPLANT
EXTRACTOR VACUUM M CUP 4 TUBE (SUCTIONS) IMPLANT
GLOVE BIOGEL PI IND STRL 7.0 (GLOVE) ×1 IMPLANT
GLOVE ORTHO TXT STRL SZ7.5 (GLOVE) ×1 IMPLANT
GOWN STRL REUS W/TWL LRG LVL3 (GOWN DISPOSABLE) ×2 IMPLANT
KIT ABG SYR 3ML LUER SLIP (SYRINGE) IMPLANT
NDL HYPO 25X5/8 SAFETYGLIDE (NEEDLE) ×1 IMPLANT
NEEDLE HYPO 25X5/8 SAFETYGLIDE (NEEDLE) ×1 IMPLANT
NS IRRIG 1000ML POUR BTL (IV SOLUTION) ×1 IMPLANT
PACK C SECTION WH (CUSTOM PROCEDURE TRAY) ×1 IMPLANT
PAD OB MATERNITY 4.3X12.25 (PERSONAL CARE ITEMS) ×1 IMPLANT
RTRCTR C-SECT PINK 25CM LRG (MISCELLANEOUS) ×1 IMPLANT
STRIP CLOSURE SKIN 1/2X4 (GAUZE/BANDAGES/DRESSINGS) IMPLANT
SUT CHROMIC 1 CTX 36 (SUTURE) ×2 IMPLANT
SUT PLAIN 0 NONE (SUTURE) IMPLANT
SUT PLAIN 2 0 XLH (SUTURE) IMPLANT
SUT VIC AB 0 CT1 27 (SUTURE) ×2
SUT VIC AB 0 CT1 27XBRD ANBCTR (SUTURE) ×2 IMPLANT
SUT VIC AB 2-0 CT1 (SUTURE) ×1 IMPLANT
SUT VIC AB 4-0 KS 27 (SUTURE) IMPLANT
TOWEL OR 17X24 6PK STRL BLUE (TOWEL DISPOSABLE) ×1 IMPLANT
TRAY FOLEY W/BAG SLVR 14FR LF (SET/KITS/TRAYS/PACK) ×1 IMPLANT
WATER STERILE IRR 1000ML POUR (IV SOLUTION) ×1 IMPLANT

## 2022-04-02 NOTE — Anesthesia Preprocedure Evaluation (Signed)
Anesthesia Evaluation  Patient identified by MRN, date of birth, ID band Patient awake    Reviewed: Allergy & Precautions, NPO status , Patient's Chart, lab work & pertinent test results  Airway Mallampati: II  TM Distance: >3 FB Neck ROM: Full    Dental   Pulmonary neg pulmonary ROS,    breath sounds clear to auscultation       Cardiovascular negative cardio ROS   Rhythm:Regular Rate:Normal     Neuro/Psych negative neurological ROS     GI/Hepatic negative GI ROS, Neg liver ROS,   Endo/Other  negative endocrine ROS  Renal/GU negative Renal ROS     Musculoskeletal   Abdominal   Peds  Hematology  (+) Blood dyscrasia, anemia ,   Anesthesia Other Findings   Reproductive/Obstetrics (+) Pregnancy                             Lab Results  Component Value Date   WBC 12.0 (H) 03/31/2022   HGB 9.7 (L) 03/31/2022   HCT 31.0 (L) 03/31/2022   MCV 73.3 (L) 03/31/2022   PLT 324 03/31/2022   Lab Results  Component Value Date   CREATININE 0.65 11/24/2021   BUN 7 11/24/2021   NA 136 11/24/2021   K 3.8 11/24/2021   CL 102 11/24/2021   CO2 20 11/24/2021    Anesthesia Physical Anesthesia Plan  ASA: 2  Anesthesia Plan: Spinal   Post-op Pain Management:    Induction:   PONV Risk Score and Plan: 2 and Dexamethasone, Ondansetron and Treatment may vary due to age or medical condition  Airway Management Planned: Natural Airway  Additional Equipment: None  Intra-op Plan:   Post-operative Plan:   Informed Consent: I have reviewed the patients History and Physical, chart, labs and discussed the procedure including the risks, benefits and alternatives for the proposed anesthesia with the patient or authorized representative who has indicated his/her understanding and acceptance.       Plan Discussed with: CRNA  Anesthesia Plan Comments:         Anesthesia Quick Evaluation

## 2022-04-02 NOTE — Transfer of Care (Signed)
Immediate Anesthesia Transfer of Care Note  Patient: Sydney Nichols  Procedure(s) Performed: REPEAT CESAREAN SECTION  Patient Location: PACU  Anesthesia Type:Spinal  Level of Consciousness: awake  Airway & Oxygen Therapy: Patient Spontanous Breathing  Post-op Assessment: Report given to RN and Post -op Vital signs reviewed and stable  Post vital signs: Reviewed and stable  Last Vitals:  Vitals Value Taken Time  BP 109/62 04/02/22 1143  Temp 36.5 C 04/02/22 1143  Pulse 90 04/02/22 1143  Resp 11 04/02/22 1143  SpO2 98 % 04/02/22 1143  Vitals shown include unvalidated device data.  Last Pain:  Vitals:   04/02/22 0835  TempSrc: Oral         Complications: No notable events documented.

## 2022-04-02 NOTE — Op Note (Signed)
Preoperative diagnosis: Intrauterine pregnancy at 39 weeks, previous c-section Postoperative diagnosis: Same Procedure: Repeat low transverse cesarean section without extensions Surgeon: Cheri Fowler M.D. Anesthesia: Spinal  Findings: Patient had normal gravid anatomy and delivered a viable female infant with Apgars of 9 and 9 weight pending Estimated blood loss: 300 cc Specimens: Placenta sent to labor and delivery Complications: None  Procedure in detail: The patient was taken to the operating room and placed in the sitting position. The anesthesiologist instilled spinal anesthesia.  She was then placed in the dorsosupine position with left tilt. Abdomen was then prepped and draped in the usual sterile fashion, and a foley catheter was inserted. The level of her anesthesia was found to be adequate. Abdomen was entered via a standard Pfannenstiel incision just inferior to her previous scar. Once the peritoneal cavity was entered the Alexis disposable self-retaining retractor was placed and good visualization was achieved. A 4 cm transverse incision was then made in the lower uterine segment pushing the bladder inferior. Once the uterine cavity was entered the incision was extended digitally, clear amniotic fluid. The fetal vertex was grasped and delivered through the incision atraumatically. Mouth and nares were suctioned. The remainder of the infant then delivered atraumatically. Cord was doubly clamped and cut after one minute and the infant handed to the awaiting pediatric team. Cord blood was obtained. The placenta delivered spontaneously. Uterus was wiped dry with clean lap pad and all clots and debris were removed. Uterine incision was inspected and found to be free of extensions. Uterine incision was closed in 1 layer with running locking #1 Chromic. Tubes and ovaries were inspected and found to be normal. Uterine incision was inspected and found to be hemostatic. Bleeding from serosal edges was  controlled with electrocautery. The Alexis retractor was removed. Subfascial space was irrigated and made hemostatic with electrocautery.  Fascia was closed in running fashion starting at both ends and meeting in the middle with 0 Vicryl. Her previous scar was removed sharply.  Subcutaneous tissue was then irrigated and made hemostatic with electrocautery, then closed with running 2-0 plain gut. Skin was closed with running 4-0 Vicryl subcuticular suture followed by steri-strips and a sterile dressing. Patient tolerated the procedure well and was taken to the recovery in stable condition. Counts were correct x2, she received Ancef 2 g IV at the beginning of the procedure, as well as 1 gm TXA, and she had PAS hose on throughout the procedure.

## 2022-04-02 NOTE — H&P (Signed)
Sydney Nichols is a 31 y.o. female, G2 P1001, EGA [redacted] weeks with [facility] 9-29 presenting for repeat c-section.  Previous LTCS, CTOL but declines VBAC.  [place] complicated by late care at 23 weeks.  OB History     Gravida  2   Para  1   Term  1   Preterm      AB      Living  1      SAB      IAB      Ectopic      Multiple  0   Live Births  1        Obstetric Comments  IND for BP, c/s for FTP        Past Medical History:  Diagnosis Date   Acne    Allergy    RHINITIS   Anemia    iron iv  x feb 2022,   Anxiety    Eczema    Elevated heart rate with elevated blood pressure without diagnosis of hypertension    seeing dr balan ? graves disease   Endometriosis    History of blood transfusion 09/2019   after c section   History of gestational hypertension 09/2019   resolved   Infertility, female    first preg IVF   Medical history non-contributory    Normal postpartum course 09/22/2019   PCOS (polycystic ovarian syndrome)    Pregnant 09/21/2019   S/P cesarean section 09/22/2019   Thyrotoxicosis dx 2022   without thryoid storm lov dr Chalmers Cater 09-24-2020 endocrinology on chart   Wears glasses    Past Surgical History:  Procedure Laterality Date   CESAREAN SECTION N/A 09/22/2019   Procedure: CESAREAN SECTION;  Surgeon: Janyth Contes, MD;  Location: [facility] LD ORS;  Service: Obstetrics;  Laterality: N/A;   CHOLECYSTECTOMY  11/2019   laparoscopic   DILATATION & CURETTAGE/HYSTEROSCOPY WITH MYOSURE N/A 10/16/2020   Procedure: DILATATION & CURETTAGE/HYSTEROSCOPY WITH MYOSURE;  Surgeon: Janyth Contes, MD;  Location: [place];  Service: Gynecology;  Laterality: N/A;   egg retrieval  12/2018   Family History: family history includes Brain cancer in her paternal aunt; Breast cancer in her maternal grandmother; Cancer in her maternal grandmother; Colon cancer in her maternal grandfather; Diabetes in her maternal grandfather and paternal grandfather;  Healthy in her mother; Heart disease in her father and paternal grandfather; Hyperlipidemia in her paternal grandfather; Hyperthyroidism in her father; Thyroid disease in her father. Social History:  reports that she has never smoked. She has never used smokeless tobacco. She reports that she does not drink alcohol and does not use drugs.     Maternal Diabetes: No Genetic Screening: Normal Maternal Ultrasounds/Referrals: Normal Fetal Ultrasounds or other Referrals:  None Maternal Substance Abuse:  No Significant Maternal Medications:  None Significant Maternal Lab Results:  Group B Strep negative Number of Prenatal Visits:greater than 3 verified prenatal visits Other Comments:  None  Review of Systems  Respiratory: Negative.    Cardiovascular: Negative.    Maternal Medical History:  Fetal activity: Perceived fetal activity is normal.   Prenatal complications: no prenatal complications Prenatal Complications - Diabetes: none.     Blood pressure 112/71, pulse 84, temperature 98 F (36.7 C), temperature source Oral, resp. rate 18, height 5\' 4"  (1.626 m), weight 102.1 kg, unknown if currently breastfeeding. Maternal Exam:  Abdomen: Patient reports no abdominal tenderness. Surgical scars: low transverse.   Estimated fetal weight is 7.5 lbs.   Fetal presentation: vertex Introitus:  Normal vulva. Normal vagina.  Amniotic fluid character: not assessed.   Physical Exam Constitutional:      Appearance: Normal appearance.  Cardiovascular:     Rate and Rhythm: Normal rate and regular rhythm.     Heart sounds: Normal heart sounds. No murmur heard. Pulmonary:     Effort: Pulmonary effort is normal. No respiratory distress.     Breath sounds: Normal breath sounds. No wheezing.  Abdominal:     Palpations: Abdomen is soft.  Genitourinary:    General: Normal vulva.  Musculoskeletal:     Cervical back: Normal range of motion and neck supple.  Neurological:     Mental Status: She is  alert.     Prenatal labs: ABO, Rh: --/--/O POS (09/20 [id number]) Antibody: NEG (09/20 [id number]) Rubella: Immune (06/07 0000) RPR: NON REACTIVE (09/20 1000)  HBsAg: Negative (06/07 0000)  HIV: Non-reactive (06/07 0000)  GBS:  neg   Assessment/Plan: IUP at 39 weeks, previous c-section, declines VBAC.  C-section procedure and risks discussed, questions answered.  Will admit for repeat c-section   Clarene Duke 04/02/2022, 8:53 AM

## 2022-04-02 NOTE — Anesthesia Procedure Notes (Signed)
Spinal  Patient location during procedure: OR Start time: 04/02/2022 10:20 AM End time: 04/02/2022 10:26 AM Reason for block: surgical anesthesia Staffing Performed: anesthesiologist  Anesthesiologist: Suzette Battiest, MD Performed by: Suzette Battiest, MD Authorized by: Suzette Battiest, MD   Preanesthetic Checklist Completed: patient identified, IV checked, site marked, risks and benefits discussed, surgical consent, monitors and equipment checked, pre-op evaluation and timeout performed Spinal Block Patient position: sitting Prep: DuraPrep Patient monitoring: heart rate, cardiac monitor, continuous pulse ox and blood pressure Approach: midline Location: L3-4 Injection technique: single-shot Needle Needle type: Pencan  Needle gauge: 24 G Needle length: 9 cm Assessment Sensory level: T4 Events: CSF return

## 2022-04-02 NOTE — Interval H&P Note (Signed)
History and Physical Interval Note:  04/02/2022 9:47 AM  Sydney Nichols  has presented today for surgery, with the diagnosis of repeat c-section.  The various methods of treatment have been discussed with the patient and family. After consideration of risks, benefits and other options for treatment, the patient has consented to  Procedure(s): REPEAT CESAREAN SECTION (N/A) as a surgical intervention.  The patient's history has been reviewed, patient examined, no change in status, stable for surgery.  I have reviewed the patient's chart and labs.  Questions were answered to the patient's satisfaction.     Blane Ohara Juandaniel Manfredo

## 2022-04-02 NOTE — Anesthesia Postprocedure Evaluation (Signed)
Anesthesia Post Note  Patient: Sydney Nichols  Procedure(s) Performed: REPEAT CESAREAN SECTION     Patient location during evaluation: PACU Anesthesia Type: Spinal Level of consciousness: awake and alert Pain management: pain level controlled Vital Signs Assessment: post-procedure vital signs reviewed and stable Respiratory status: spontaneous breathing and respiratory function stable Cardiovascular status: blood pressure returned to baseline and stable Postop Assessment: spinal receding Anesthetic complications: no   No notable events documented.  Last Vitals:  Vitals:   04/02/22 1318 04/02/22 1420  BP: 102/63 (!) 108/57  Pulse: 60 62  Resp: 16 16  Temp: (!) 36.4 C 36.4 C  SpO2: 95% 97%    Last Pain:  Vitals:   04/02/22 1420  TempSrc: Oral  PainSc: 4    Pain Goal:                   Tiajuana Amass

## 2022-04-03 LAB — CBC
HCT: 26.4 % — ABNORMAL LOW (ref 36.0–46.0)
Hemoglobin: 8.2 g/dL — ABNORMAL LOW (ref 12.0–15.0)
MCH: 23 pg — ABNORMAL LOW (ref 26.0–34.0)
MCHC: 31.1 g/dL (ref 30.0–36.0)
MCV: 74.2 fL — ABNORMAL LOW (ref 80.0–100.0)
Platelets: 272 10*3/uL (ref 150–400)
RBC: 3.56 MIL/uL — ABNORMAL LOW (ref 3.87–5.11)
RDW: 16.2 % — ABNORMAL HIGH (ref 11.5–15.5)
WBC: 14.6 10*3/uL — ABNORMAL HIGH (ref 4.0–10.5)
nRBC: 0 % (ref 0.0–0.2)

## 2022-04-03 LAB — BIRTH TISSUE RECOVERY COLLECTION (PLACENTA DONATION)

## 2022-04-03 MED ORDER — FERROUS GLUCONATE 324 (38 FE) MG PO TABS
324.0000 mg | ORAL_TABLET | Freq: Two times a day (BID) | ORAL | Status: DC
  Administered 2022-04-03 – 2022-04-05 (×4): 324 mg via ORAL
  Filled 2022-04-03 (×4): qty 1

## 2022-04-03 MED ORDER — CALCIUM CARBONATE ANTACID 500 MG PO CHEW
2.0000 | CHEWABLE_TABLET | ORAL | Status: DC | PRN
  Administered 2022-04-03 – 2022-04-04 (×2): 400 mg via ORAL
  Filled 2022-04-03 (×2): qty 2

## 2022-04-03 MED ORDER — CALCIUM CARBONATE ANTACID 500 MG PO CHEW: 1.0000 | CHEWABLE_TABLET | Freq: Four times a day (QID) | ORAL | Status: DC | PRN

## 2022-04-03 MED ORDER — LACTATED RINGERS IV BOLUS
500.0000 mL | Freq: Once | INTRAVENOUS | Status: AC
  Administered 2022-04-03: 500 mL via INTRAVENOUS

## 2022-04-03 NOTE — Progress Notes (Signed)
Patient is eating, ambulating, voiding.  Pain control is good.  Appropriate lochia, no complaints.  Vitals:   04/02/22 1420 04/02/22 1753 04/02/22 2230 04/03/22 0200  BP: (!) 108/57 (!) 93/52 (!) 96/52 99/61  Pulse: 62 64 71 60  Resp: 16 14 16 16   Temp: 97.6 F (36.4 C) 98 F (36.7 C) 98 F (36.7 C) 98 F (36.7 C)  TempSrc: Oral Oral Oral   SpO2: 97% 98% 98%   Weight:      Height:        Fundus firm Inc: minimal blood outlined on right aspect of dressing, otherwise intact Ext: no calf tenderness  Lab Results  Component Value Date   WBC 14.6 (H) 04/03/2022   HGB 8.2 (L) 04/03/2022   HCT 26.4 (L) 04/03/2022   MCV 74.2 (L) 04/03/2022   PLT 272 04/03/2022    --/--/O POS (09/20 8891)  A/P Post op day #1 s/p repeat c/s Circ desired, complete. Doing well Acute blood loss anemia: Hb 8.2- ferrous gluconate added bid  Routine care.  Expect d/c 9/24.    Allyn Kenner

## 2022-04-03 NOTE — Progress Notes (Signed)
MOB was referred for history of anxiety. * Referral screened out by Clinical Social Worker because none of the following criteria appear to apply: ~ History of anxiety/depression during this pregnancy, or of post-partum depression following prior delivery. ~ Diagnosis of anxiety and/or depression within last 3 years OR * MOB's symptoms currently being treated with medication and/or therapy. MOB has a current prescription for Wellbutrin 100mg .   Please contact the Clinical Social Worker if needs arise, by [facility] request, or if MOB scores greater than 9/yes to question 10 on Edinburgh Postpartum Depression Screen.  Signed,  Berniece Salines, MSW, LCSWA, LCASA 04/03/2022 8:44 AM

## 2022-04-04 MED ORDER — OXYCODONE HCL 5 MG PO TABS: 5.0000 mg | ORAL_TABLET | ORAL | 0 refills | Status: DC | PRN

## 2022-04-04 NOTE — Discharge Summary (Addendum)
Postpartum Discharge Summary    Patient Name: Sydney Nichols DOB: 1990-10-14 MRN: [id number]  Date of admission: 04/02/2022 Delivery date:04/02/2022  Delivering provider: Jackelyn Knife, TODD  Date of discharge: 04/05/2022  Admitting diagnosis: Maternal care due to low transverse uterine scar from previous cesarean delivery [O34.211] Intrauterine pregnancy: [redacted]w[redacted]d     Secondary diagnosis:  Principal Problem:   Maternal care due to low transverse uterine scar from previous cesarean delivery  Additional problems: iron deficiency anemia/acute blood loss anemia    Discharge diagnosis: Term Pregnancy Delivered and Anemia                                              Post partum procedures: none Augmentation: N/A Complications: None  Hospital course: Sceduled C/S   31 y.o. yo [id number] at [redacted]w[redacted]d was admitted to the hospital 04/02/2022 for scheduled cesarean section with the following indication:Elective Repeat.Delivery details are as follows:  Membrane Rupture Time/Date: 10:52 AM ,04/02/2022   Delivery Method:C-Section, Low Transverse  Details of operation can be found in separate operative note.  Patient had an uncomplicated postpartum course.  She is ambulating, tolerating a regular diet, passing flatus, and urinating well. Patient is discharged home in stable condition on  04/05/22        Newborn Data: Birth date:04/02/2022  Birth time:10:53 AM  Gender:Female  Living status:Living  Apgars:9 ,9  Weight:3490 g     Magnesium Sulfate received: No BMZ received: No Rhophylac:N/A Transfusion:No  Physical exam  Vitals:   04/04/22 0631 04/04/22 1717 04/04/22 2200 04/05/22 0610  BP: 112/69 111/66 (!) 106/59 103/64  Pulse: 63 74 77 72  Resp: 18 18 18 18   Temp: 98.2 F (36.8 C) 98.4 F (36.9 C) 98.4 F (36.9 C) 98.2 F (36.8 C)  TempSrc: Oral Oral Oral Oral  SpO2:  99% 98% 99%  Weight:      Height:       General: alert, cooperative, and no distress Lochia: appropriate Uterine Fundus:  firm Incision: Healing well with no significant drainage, No significant erythema DVT Evaluation: No evidence of DVT seen on physical exam. Labs: Lab Results  Component Value Date   WBC 14.6 (H) 04/03/2022   HGB 8.2 (L) 04/03/2022   HCT 26.4 (L) 04/03/2022   MCV 74.2 (L) 04/03/2022   PLT 272 04/03/2022      Latest Ref Rng & Units 11/24/2021    3:43 PM  CMP  Glucose 70 - 99 mg/dL 98   BUN 6 - 20 mg/dL 7   Creatinine 11/26/2021 - 2.99 mg/dL 2.42   Sodium 6.83 - 419 mmol/L 136   Potassium 3.5 - 5.2 mmol/L 3.8   Chloride 96 - 106 mmol/L 102   CO2 20 - 29 mmol/L 20   Calcium 8.7 - 10.2 mg/dL 9.2   Total Protein 6.0 - 8.5 g/dL 6.7   Total Bilirubin 0.0 - 1.2 mg/dL 622   Alkaline Phos 44 - 121 IU/L 95   AST 0 - 40 IU/L 12   ALT 0 - 32 IU/L 9    Edinburgh Score:    04/04/2022    4:36 PM  Edinburgh Postnatal Depression Scale Screening Tool  I have been able to laugh and see the funny side of things. 0  I have looked forward with enjoyment to things. 0  I have blamed myself unnecessarily when things went wrong.  1  I have been anxious or worried for no good reason. 2  I have felt scared or panicky for no good reason. 0  Things have been getting on top of me. 0  I have been so unhappy that I have had difficulty sleeping. 0  I have felt sad or miserable. 0  I have been so unhappy that I have been crying. 0  The thought of harming myself has occurred to me. 0  Edinburgh Postnatal Depression Scale Total 3      After visit meds:   Oxycodone Rx sent  to pharmacy  Allergies as of 04/05/2022       Reactions   Hydrocodone-acetaminophen Rash   Makes her itchy        Medication List     STOP taking these medications    buPROPion ER 100 MG 12 hr tablet Commonly known as: Wellbutrin SR   metoCLOPramide 10 MG tablet Commonly known as: REGLAN       TAKE these medications    Fusion 65-65-25-30 MG Caps Take 1 capsule by mouth daily.   ibuprofen 800 MG tablet Commonly  known as: ADVIL Take 1 tablet (800 mg total) by mouth every 8 (eight) hours as needed.   multivitamin-prenatal 27-0.8 MG Tabs tablet Take 1 tablet by mouth daily.   oxyCODONE 5 MG immediate release tablet Commonly known as: Roxicodone Take 1 tablet (5 mg total) by mouth every 6 (six) hours as needed for severe pain.               Discharge Care Instructions  (From admission, onward)           Start     Ordered   04/04/22 0000  Discharge wound care:       Comments: Can remove dressing and steri strips 5 days from date of surgery   04/04/22 1112             Discharge home in stable condition Infant Feeding: Bottle Infant Disposition:home with mother Discharge instruction: per After Visit Summary and Postpartum booklet. Activity: Advance as tolerated. Pelvic rest for 6 weeks.  Diet: routine diet Anticipated Birth Control: Unsure Postpartum Appointment: 2-4 weeks Additional Postpartum F/U: Incision check as above Future Appointments:No future appointments. Follow up Visit:  Follow-up Information     Meisinger, Sherren Mocha, MD. Call.   Specialty: Obstetrics and Gynecology Why: call office for postpartum check for 2-4 weeks Contact information: [address] [place] [address] [place] [address] [phone]                     04/05/2022 Deliah Boston, MD

## 2022-04-05 MED ORDER — OXYCODONE HCL 5 MG PO TABS: 5.0000 mg | ORAL_TABLET | Freq: Four times a day (QID) | ORAL | 0 refills | Status: DC | PRN

## 2022-04-05 MED ORDER — IBUPROFEN 800 MG PO TABS: 800.0000 mg | ORAL_TABLET | Freq: Three times a day (TID) | ORAL | 1 refills | Status: DC | PRN

## 2022-04-05 NOTE — Progress Notes (Signed)
Patient is eating, ambulating, voiding.  Pain control is good.  Appropriate lochia, no complaints.Passing flatus, no BM yet. Baby cleared for discharge at this time  Vitals:   04/04/22 0631 04/04/22 1717 04/04/22 2200 04/05/22 0610  BP: 112/69 111/66 (!) 106/59 103/64  Pulse: 63 74 77 72  Resp: 18 18 18 18   Temp: 98.2 F (36.8 C) 98.4 F (36.9 C) 98.4 F (36.9 C) 98.2 F (36.8 C)  TempSrc: Oral Oral Oral Oral  SpO2:  99% 98% 99%  Weight:      Height:        Fundus firm Inc: minimal blood outlined on right aspect of dressing, otherwise intact Ext: no calf tenderness  Lab Results  Component Value Date   WBC 14.6 (H) 04/03/2022   HGB 8.2 (L) 04/03/2022   HCT 26.4 (L) 04/03/2022   MCV 74.2 (L) 04/03/2022   PLT 272 04/03/2022    --/--/O POS (09/20 6815)  A/P Post op day #3 s/p repeat c/s Baby s/p circ pOD#2 Doing well Acute blood loss anemia: Hb 8.2- stable and asymptomatic DC home today with incision check in 2 wks.   [place]

## 2022-04-10 ENCOUNTER — Telehealth (HOSPITAL_COMMUNITY): Payer: Self-pay

## 2022-04-10 NOTE — Telephone Encounter (Signed)
Patient did not answer phone call. Voicemail left for patient.   Sydney Nichols [facility] 04/10/2022,0920

## 2022-04-20 ENCOUNTER — Encounter: Payer: Self-pay | Admitting: Internal Medicine

## 2022-05-25 ENCOUNTER — Encounter: Payer: Self-pay | Admitting: Internal Medicine

## 2022-06-23 IMAGING — NM NM THYROID IMAGING W/ UPTAKE MULTI (4&24 HR)
4 series · 4 of 4 positions shown · non-contrast
Comparison: None

CLINICAL DATA: Thyrotoxicosis

EXAM:
THYROID SCAN AND UPTAKE - 4 AND 24 HOURS
TECHNIQUE: Following oral administration of W-F9P capsule, anterior planar
imaging was acquired at 24 hours. Thyroid uptake was calculated with
a thyroid probe at 4-6 hours and 24 hours.
RADIOPHARMACEUTICALS:  445 uCi W-F9P sodium iodide p.o.

[iu thyroid uptake i123 · 3.10mm/px · 1 of 1 slices shown (1 of 4)]
[im 1/1  full-range]
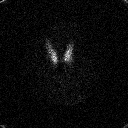

[iu thyroid uptake i123 · 3.10mm/px · 1 of 1 slices shown (2 of 4)]
[im 1/1  full-range]
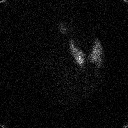

[iu thyroid uptake i123 · 3.10mm/px · 1 of 1 slices shown (3 of 4)]
[im 1/1  full-range]
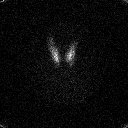

[iu thyroid uptake i123 · 3.10mm/px · 1 of 1 slices shown (4 of 4)]
[im 1/1  full-range]
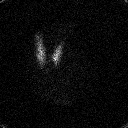

[4 of 4 positions shown; findings below may reference images not displayed]

FINDINGS: Homogeneous tracer distribution in both thyroid lobes.

No focal areas of increased or decreased tracer uptake identified.

4 hour W-F9P uptake = 1.9% (normal 5-20%)

24 hour W-F9P uptake = 4.5% (normal 10-30%)
IMPRESSION: Normal thyroid scan.

Decreased 4 hour and 24 hour radio iodine uptakes as above.

## 2022-08-19 ENCOUNTER — Ambulatory Visit (INDEPENDENT_AMBULATORY_CARE_PROVIDER_SITE_OTHER): Payer: BC Managed Care – PPO | Admitting: Nurse Practitioner

## 2022-08-19 ENCOUNTER — Encounter: Payer: Self-pay | Admitting: Nurse Practitioner

## 2022-08-19 VITALS — BP 120/74 | HR 86 | Wt 202.0 lb

## 2022-08-19 DIAGNOSIS — B9689 Other specified bacterial agents as the cause of diseases classified elsewhere: Secondary | ICD-10-CM | POA: Insufficient documentation

## 2022-08-19 DIAGNOSIS — J069 Acute upper respiratory infection, unspecified: Secondary | ICD-10-CM | POA: Diagnosis not present

## 2022-08-19 DIAGNOSIS — F419 Anxiety disorder, unspecified: Secondary | ICD-10-CM | POA: Diagnosis not present

## 2022-08-19 MED ORDER — HYDROXYZINE HCL 25 MG PO TABS: 25.0000 mg | ORAL_TABLET | Freq: Two times a day (BID) | ORAL | 3 refills | Status: DC

## 2022-08-19 MED ORDER — PREDNISONE 50 MG PO TABS: 50.0000 mg | ORAL_TABLET | Freq: Every day | ORAL | 0 refills | Status: DC

## 2022-08-19 MED ORDER — TRELEGY ELLIPTA 200-62.5-25 MCG/ACT IN AEPB: INHALATION_SPRAY | RESPIRATORY_TRACT | 0 refills | Status: DC

## 2022-08-19 MED ORDER — AZITHROMYCIN 500 MG PO TABS: 500.0000 mg | ORAL_TABLET | Freq: Every day | ORAL | 0 refills | Status: DC

## 2022-08-19 MED ORDER — ALBUTEROL SULFATE HFA 108 (90 BASE) MCG/ACT IN AERS: 2.0000 | INHALATION_SPRAY | Freq: Four times a day (QID) | RESPIRATORY_TRACT | 0 refills | Status: DC | PRN

## 2022-08-19 NOTE — Assessment & Plan Note (Signed)
Hydroxyzine started for postpartum anxiety with good control. Will send refills today.  She has adequate supply of wellbutrin at this time.

## 2022-08-19 NOTE — Progress Notes (Signed)
Orma Render, DNP, AGNP-c [place] [address], [place] [address] [phone]  Subjective:   Sydney Nichols is a 32 y.o. female presents to day for evaluation of: Sydney Nichols presents with a persistent cough that started in November after her children had RSV. She reports that the cough has improved slightly but has not completely resolved. She has tried cough syrup, Mucinex, and Tylenol sinus without significant relief. She denies any previous diagnosis of asthma but mentions her mother has asthma.  Chelsea describes having green mucus in the morning that tends to subside after coughing it up. She experiences difficulty catching her breath, particularly in the morning and during presentations at work. Her children have recovered from their coughs, and she has tested negative for COVID-19.  The patient denies any fever or facial pain or pressure. She reports lower back pain on both sides. She has not used a nebulizer before and has no known history of pneumonia. She has taken prednisone in the past. Sydney Nichols is currently taking Hydroxyzine for postpartum symptoms and Wellbutrin. She prefers three-month prescriptions and has not experienced yeast infections with antibiotics.  PMH, Medications, and Allergies reviewed and updated in chart as appropriate.   ROS negative except for what is listed in HPI. Objective:  BP 120/74   Pulse 86   Wt 202 lb (91.6 kg)   BMI 34.67 kg/m  Physical Exam Vitals and nursing note reviewed.  Constitutional:      Appearance: Normal appearance.  HENT:     Head: Normocephalic.  Eyes:     Extraocular Movements: Extraocular movements intact.     Pupils: Pupils are equal, round, and reactive to light.  Cardiovascular:     Rate and Rhythm: Normal rate and regular rhythm.     Pulses: Normal pulses.     Heart sounds: Normal heart sounds.  Pulmonary:     Effort: Pulmonary effort is normal.     Breath sounds: Wheezing and rhonchi  present.  Abdominal:     General: Bowel sounds are normal.     Palpations: Abdomen is soft.  Musculoskeletal:     Cervical back: Normal range of motion.  Lymphadenopathy:     Cervical: Cervical adenopathy present.  Skin:    General: Skin is warm and dry.     Capillary Refill: Capillary refill takes less than 2 seconds.  Neurological:     General: No focal deficit present.     Mental Status: She is alert and oriented to person, place, and time.  Psychiatric:        Mood and Affect: Mood normal.           Assessment & Plan:   Problem List Items Addressed This Visit     Anxiety    Hydroxyzine started for postpartum anxiety with good control. Will send refills today.  She has adequate supply of wellbutrin at this time.       Relevant Medications   buPROPion (WELLBUTRIN) 100 MG tablet   hydrOXYzine (ATARAX) 25 MG tablet   Bacterial URI - Primary    Wheezing throughout all lung fields with crackles noted in the bases bilaterally. Suspect that she may have infectious source given the length of time she has had symptoms.  Azithromycin: Prescribe for possible bacterial infection and walking pneumonia. Prednisone: Prescribe for reducing lung inflammation. Inhaler sample of Trellegy: Provide for reduced lung inflammation. 2 week course Follow-up: Consider chest x-ray if no improvement within a week.      Relevant Medications  azithromycin (ZITHROMAX) 500 MG tablet   albuterol (VENTOLIN HFA) 108 (90 Base) MCG/ACT inhaler   Fluticasone-Umeclidin-Vilant (TRELEGY ELLIPTA) 200-62.5-25 MCG/ACT AEPB   predniSONE (DELTASONE) 50 MG tablet      Orma Render, DNP, AGNP-c 08/19/2022  6:50 PM    History, Medications, Surgery, SDOH, and Family History reviewed and updated as appropriate.

## 2022-08-19 NOTE — Assessment & Plan Note (Signed)
Wheezing throughout all lung fields with crackles noted in the bases bilaterally. Suspect that she may have infectious source given the length of time she has had symptoms.  Azithromycin: Prescribe for possible bacterial infection and walking pneumonia. Prednisone: Prescribe for reducing lung inflammation. Inhaler sample of Trellegy: Provide for reduced lung inflammation. 2 week course Follow-up: Consider chest x-ray if no improvement within a week.

## 2022-08-19 NOTE — Patient Instructions (Signed)
Thank you for visiting the office today. I appreciate your commitment to improving your health. Based on our conversation, here are the key instructions for your care:  - Start a course of azithromycin to address potential bacterial infection. - Begin prednisone, to reduce inflammation. - Use the provided inhaler sample for a couple of weeks, which includes an anti-inflammatory, a long-acting bronchodilator, and an anti-muscarinic to help with inflammation and cough - A prescription for the inhaler will also be sent to your pharmacy for use during coughing fits. This is as needed - Continue taking Hydroxyzine as previously prescribed for postpartum anxiety. I will send in a year of refills for you - Ensure you have enough Wellbutrin for your ongoing needs. Let me know when you need refills - Monitor for any symptoms of yeast infection following antibiotic use and message me if treatment is needed. - If your respiratory symptoms do not improve within a week, please contact me so we can consider further investigation, such as an x-ray.  Your medications and refills will be sent to [facility] on [address]. Please follow the instructions carefully and do not hesitate to reach out if you have any questions or concerns.

## 2022-08-23 ENCOUNTER — Encounter: Payer: Self-pay | Admitting: Nurse Practitioner

## 2022-08-23 DIAGNOSIS — B9689 Other specified bacterial agents as the cause of diseases classified elsewhere: Secondary | ICD-10-CM

## 2022-08-23 DIAGNOSIS — R0789 Other chest pain: Secondary | ICD-10-CM

## 2022-08-25 NOTE — Addendum Note (Signed)
Addended by: Dinari Stgermaine, Clarise Cruz E on: 08/25/2022 07:02 PM   Modules accepted: Orders

## 2022-10-19 ENCOUNTER — Other Ambulatory Visit: Payer: Self-pay | Admitting: Physician Assistant

## 2022-10-19 NOTE — Telephone Encounter (Signed)
Refill request last apt 08/19/22.

## 2023-01-03 ENCOUNTER — Other Ambulatory Visit: Payer: Self-pay | Admitting: Physician Assistant

## 2023-01-03 DIAGNOSIS — F419 Anxiety disorder, unspecified: Secondary | ICD-10-CM

## 2023-02-08 ENCOUNTER — Encounter: Payer: Self-pay | Admitting: Nurse Practitioner

## 2023-03-17 ENCOUNTER — Telehealth (INDEPENDENT_AMBULATORY_CARE_PROVIDER_SITE_OTHER): Payer: BC Managed Care – PPO | Admitting: Nurse Practitioner

## 2023-03-17 ENCOUNTER — Encounter: Payer: Self-pay | Admitting: Nurse Practitioner

## 2023-03-17 VITALS — Ht 64.0 in | Wt 200.0 lb

## 2023-03-17 DIAGNOSIS — F419 Anxiety disorder, unspecified: Secondary | ICD-10-CM

## 2023-03-17 MED ORDER — SERTRALINE HCL 25 MG PO TABS: ORAL_TABLET | ORAL | 3 refills | Status: DC

## 2023-03-17 MED ORDER — ALPRAZOLAM 0.5 MG PO TABS: 0.2500 mg | ORAL_TABLET | Freq: Two times a day (BID) | ORAL | 2 refills | Status: DC | PRN

## 2023-03-17 NOTE — Progress Notes (Signed)
Virtual Visit Encounter mychart visit.   I connected with  Sydney Nichols on 03/17/23 at 10:30 AM EDT by secure video and audio telemedicine application. I verified that I am speaking with the correct person using two identifiers.   I introduced myself as a Publishing rights manager with the practice. The limitations of evaluation and management by telemedicine discussed with the patient and the availability of in person appointments. The patient expressed verbal understanding and consent to proceed.  Participating parties in this visit include: Myself and patient  The patient is: Patient Location: Other:  car I am: Provider Location: Office/Clinic Subjective:    CC and HPI: Sydney Nichols is a 32 y.o. year old female presenting for follow up of anxiety. Patient reports the following:  Sydney Nichols reports increased anxiety symptoms with short temper, difficulty focusing/concentrating, episodes of tearfulness, and sadness. She reports sudden onset of symptoms with a sensation of her heart racing and anxious thoughts. She tells me she is under quite a bit of stress caring for 2 children under 2 years, working a stressful job in IT consultant, and being in graduate school. She has been on wellbutrin and PRN hydroxyzine which were helpful in the past to manage her symptoms, but at this time they do not feel effective. She tells me she did try lexapro at one time but they stopped this due to nausea. She tells me she later learned she was in Omarian Jaquith pregnancy at that time so it isn't clear if that was the cause of the nausea. She is open to adding a daily medication and is in need of a short acting medication for panic symptoms.   In grad school, 2 smal chldren, working, stress job in Cabin crew ed,   Past medical history, Surgical history, Family history not pertinant except as noted below, Social history, Allergies, and medications have been entered into the medical record, reviewed, and corrections made.    Review of Systems:  All review of systems negative except what is listed in the HPI  Objective:    Alert and oriented x 4 Speaking in clear sentences with no shortness of breath. She is tearful, but in no distress.   Impression and Recommendations:    Problem List Items Addressed This Visit     Anxiety - Primary    At this time her anxiety symptoms are not well controlled and they are affecting her daily life. We discussed options for treatment. At this time I feel it would be best to continue the wellbutrin and add sertraline. We can consider tapering the wellbutrin in the future if she is stable on the sertraline to see if she is able to manage. I did discuss that we often dose both of these medications together for anxiety and depression and it may be beneficial to leave both in place. We will continue to monitor. I will also add PRN xanax for panic symptoms while her medication is taking effect. She is having episodes of panic attacks that are not controlled with hydroxyzine. I feel that a stronger medication is needed at this time to help establish better control. We discussed that her symptoms are very consistent with anxiety and panic. We also discussed If the current medication options are not effective, we can make changes. Reassurance and support provided today. No alarm symptoms are present. We will monitor closely and plan to follow-up in about 4 weeks.       Relevant Medications   sertraline (ZOLOFT) 25 MG tablet  ALPRAZolam (XANAX) 0.5 MG tablet    orders and follow up as documented in EMR I discussed the assessment and treatment plan with the patient. The patient was provided an opportunity to ask questions and all were answered. The patient agreed with the plan and demonstrated an understanding of the instructions.   The patient was advised to call back or seek an in-person evaluation if the symptoms worsen or if the condition fails to improve as  anticipated.  Follow-Up: in a month  I provided 36 minutes of non-face-to-face interaction with this non face-to-face encounter including intake, same-day documentation, and chart review.   Tollie Eth, NP , DNP, AGNP-c [facility] Medicine

## 2023-03-17 NOTE — Assessment & Plan Note (Signed)
At this time her anxiety symptoms are not well controlled and they are affecting her daily life. We discussed options for treatment. At this time I feel it would be best to continue the wellbutrin and add sertraline. We can consider tapering the wellbutrin in the future if she is stable on the sertraline to see if she is able to manage. I did discuss that we often dose both of these medications together for anxiety and depression and it may be beneficial to leave both in place. We will continue to monitor. I will also add PRN xanax for panic symptoms while her medication is taking effect. She is having episodes of panic attacks that are not controlled with hydroxyzine. I feel that a stronger medication is needed at this time to help establish better control. We discussed that her symptoms are very consistent with anxiety and panic. We also discussed If the current medication options are not effective, we can make changes. Reassurance and support provided today. No alarm symptoms are present. We will monitor closely and plan to follow-up in about 4 weeks.

## 2023-03-18 ENCOUNTER — Other Ambulatory Visit: Payer: Self-pay | Admitting: Nurse Practitioner

## 2023-03-18 DIAGNOSIS — F419 Anxiety disorder, unspecified: Secondary | ICD-10-CM

## 2023-03-18 MED ORDER — SERTRALINE HCL 50 MG PO TABS: ORAL_TABLET | ORAL | 0 refills | Status: DC

## 2023-05-09 ENCOUNTER — Encounter: Payer: Self-pay | Admitting: Nurse Practitioner

## 2023-05-09 ENCOUNTER — Telehealth (INDEPENDENT_AMBULATORY_CARE_PROVIDER_SITE_OTHER): Payer: BC Managed Care – PPO | Admitting: Nurse Practitioner

## 2023-05-09 VITALS — Wt 205.0 lb

## 2023-05-09 DIAGNOSIS — F419 Anxiety disorder, unspecified: Secondary | ICD-10-CM

## 2023-05-09 DIAGNOSIS — R41840 Attention and concentration deficit: Secondary | ICD-10-CM | POA: Diagnosis not present

## 2023-05-09 MED ORDER — AMPHETAMINE-DEXTROAMPHETAMINE 10 MG PO TABS: 10.0000 mg | ORAL_TABLET | Freq: Two times a day (BID) | ORAL | 0 refills | Status: DC

## 2023-05-09 NOTE — Assessment & Plan Note (Signed)
Improved mood and sleep with current regimen of Sertraline, Wellbutrin, and Xanax. No reported side effects. She reports feeling like a better wife and mother with control of her symptoms.  -Continue current regimen of Sertraline, Wellbutrin, and Xanax.

## 2023-05-09 NOTE — Assessment & Plan Note (Signed)
Reports difficulty with sustained attention despite improvement in anxiety symptoms. This has been an ongoing issue, we were hopeful that improved anxiety would help resolve. Discussed potential benefits and side effects of adding a stimulant medication. -Start Adderall at a low dose in the morning, with the option to add an afternoon dose if needed and if not interfering with sleep. -Check in after a few weeks to assess response and adjust dosage as needed.

## 2023-05-09 NOTE — Progress Notes (Signed)
Virtual Visit Encounter mychart visit.   I connected with  Sydney Nichols on 05/09/23 at  1:30 PM EDT by secure video and audio telemedicine application. I verified that I am speaking with the correct person using two identifiers.   I introduced myself as a Publishing rights manager with the practice. The limitations of evaluation and management by telemedicine discussed with the patient and the availability of in person appointments. The patient expressed verbal understanding and consent to proceed.  Participating parties in this visit include: Myself and patient  The patient is: Patient Location: Other:  car I am: Provider Location: Office/Clinic Subjective:    CC and HPI: Sydney Nichols is a 32 y.o. year old female presenting for new evaluation and treatment of anxiety and focus.  History of Present Illness Trace, currently on sertraline, Wellbutrin, and Xanax as needed, reports significant improvement in sleep quality and overall mood since the last consultation. She notes that the combination of these medications has been beneficial, particularly in managing anxiety symptoms. The patient has been taking Wellbutrin at night, which she believes aids in sleep, and Xanax in the morning or as needed for anxiety. She initially experienced fatigue with morning intake of sertraline, but shifting it to nighttime has resolved this issue.  The patient reports a decrease in symptoms of irritability and tearfulness, indicating an improvement in her mood. 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 Despite the improvement in anxiety, she has not noticed a significant enhancement in focus or attention.  The patient's spouse is on Vyvanse for attention issues, but the patient does not believe her attention issues are as severe. She expresses a desire for a medication that will not interfere with her sleep.  The patient has no known side effects from her current medications and is open to trying a low dose of Adderall to address her attention issues. She is currently well-stocked with her other medications, with ten doses of Xanax remaining with refills.  Past medical history, Surgical history, Family history not pertinant except as noted below, Social history, Allergies, and medications have been entered into the medical record, reviewed, and corrections made.   Review of Systems:  All review of systems negative except what is listed in the HPI  Objective:    Alert and oriented x 4 Speaking in clear sentences with no shortness of breath. No distress.  Impression and Recommendations:    Problem List Items Addressed This Visit     Anxiety    Improved mood and sleep with current regimen of Sertraline, Wellbutrin, and Xanax. No reported side effects. She reports feeling like a better wife and mother with control of her symptoms.  -Continue current regimen of Sertraline, Wellbutrin, and Xanax.      Attention and concentration deficit - Primary    Reports difficulty with sustained attention despite improvement in anxiety symptoms. This has been an ongoing issue, we were hopeful that improved anxiety would help resolve. Discussed potential benefits and side effects of adding a stimulant medication. -Start Adderall at a low dose in the morning, with the option to add an afternoon dose if needed and if not interfering with sleep. -Check in after a few weeks to assess response and adjust dosage as needed.      Relevant Medications   amphetamine-dextroamphetamine (ADDERALL) 10 MG tablet    orders and follow up as documented in EMR I discussed the assessment and treatment plan  with the patient. The patient was provided an opportunity to ask questions and all were answered. The patient agreed with the plan and demonstrated an understanding of the instructions.   The patient was advised to call  back or seek an in-person evaluation if the symptoms worsen or if the condition fails to improve as anticipated.  Follow-Up: 4 months  I provided 19 minutes of non-face-to-face interaction with this non face-to-face encounter including intake, same-day documentation, and chart review.   Tollie Eth, NP , DNP, AGNP-c [facility]

## 2023-05-09 NOTE — Patient Instructions (Signed)
I am SO HAPPY that you are feeling better!! We will plan to keep up your current treatment. Please let me know when you need refills.   I have sent in the adderall, like we discussed, in a 10mg  dose up to twice a day.  Please touch base with me in about 2-3 weeks to let me know if this is working well for you or if there are any concerns. We can adjust as needed and I will send in the refills for the next 3 months at that time.  If you have any concerns, please don't hesitate to reach out.

## 2023-05-25 ENCOUNTER — Encounter: Payer: Self-pay | Admitting: Nurse Practitioner

## 2023-05-25 DIAGNOSIS — R41840 Attention and concentration deficit: Secondary | ICD-10-CM

## 2023-05-27 ENCOUNTER — Telehealth (INDEPENDENT_AMBULATORY_CARE_PROVIDER_SITE_OTHER): Payer: BC Managed Care – PPO | Admitting: Nurse Practitioner

## 2023-05-27 ENCOUNTER — Encounter: Payer: Self-pay | Admitting: Nurse Practitioner

## 2023-05-27 VITALS — Wt 200.0 lb

## 2023-05-27 DIAGNOSIS — R41840 Attention and concentration deficit: Secondary | ICD-10-CM

## 2023-05-27 MED ORDER — AMPHETAMINE-DEXTROAMPHETAMINE 10 MG PO TABS: ORAL_TABLET | ORAL | 0 refills | Status: DC
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
## 2023-05-28 ENCOUNTER — Telehealth: Payer: Self-pay | Admitting: Nurse Practitioner

## 2023-05-28 NOTE — Telephone Encounter (Signed)
P.A. AMPHETAMINE-DEXTROAMPHETAMINE completed, pt does not have pharmacy coverage, checked [facility] & options are  [facility] $28.49 [facility] $44.22 Will call patient

## 2023-05-31 NOTE — Telephone Encounter (Signed)
Called pt & informed, she states she used [facility] for her last Rx.  She states she will check with her husband about the pharmacy coverage.  I tried calling ins t# [phone] & was told pt doesn't have pharmacy benefits & said if pt thinks she does she needs to call provider services on the back of her ins card.

## 2023-06-06 NOTE — Assessment & Plan Note (Signed)
Current medication regimen is somewhat effective but insufficient for prolonged focus during exams. Experienced difficulty concentrating during a recent two-hour exam, resulting in a significantly lower score than usual. No significant side effects from the medication, though decreased appetite is managed by adjusting meal times. Prefers not to increase the afternoon dose to avoid sleep disturbances. Discussed potential need for exam accommodations, such as extended time or a separate testing environment. Increasing the morning dose may help without affecting sleep. Informed that dose adjustments are common initially but usually stabilize long-term. - Increase morning dose to 15 mg by taking one and a half 10 mg tablets. - If 15 mg is insufficient, increase to 20 mg by taking two 10 mg tablets in the morning. - Send prescription for the adjusted dose to the [facility] pharmacy. - Complete prior authorization for the medication with the insurance company. - Consider exam accommodations if needed in the future.

## 2023-06-16 ENCOUNTER — Other Ambulatory Visit: Payer: Self-pay | Admitting: Nurse Practitioner

## 2023-06-16 DIAGNOSIS — F419 Anxiety disorder, unspecified: Secondary | ICD-10-CM

## 2023-06-16 NOTE — Telephone Encounter (Signed)
Last apt 05/27/23.

## 2023-06-20 ENCOUNTER — Other Ambulatory Visit: Payer: Self-pay | Admitting: Nurse Practitioner

## 2023-06-20 DIAGNOSIS — F419 Anxiety disorder, unspecified: Secondary | ICD-10-CM

## 2023-06-20 NOTE — Telephone Encounter (Signed)
05/27/23 last apt.

## 2023-06-21 MED ORDER — AMPHETAMINE-DEXTROAMPHETAMINE 20 MG PO TABS: 20.0000 mg | ORAL_TABLET | Freq: Two times a day (BID) | ORAL | 0 refills | Status: DC

## 2023-07-18 ENCOUNTER — Other Ambulatory Visit: Payer: Self-pay | Admitting: Family Medicine

## 2023-07-18 DIAGNOSIS — F419 Anxiety disorder, unspecified: Secondary | ICD-10-CM

## 2023-07-18 NOTE — Telephone Encounter (Signed)
 Last apt 05/27/23.

## 2023-09-12 ENCOUNTER — Other Ambulatory Visit: Payer: Self-pay | Admitting: Nurse Practitioner

## 2023-09-12 DIAGNOSIS — R41840 Attention and concentration deficit: Secondary | ICD-10-CM

## 2023-09-12 NOTE — Telephone Encounter (Signed)
 Last apt 05/27/23.

## 2023-09-13 ENCOUNTER — Encounter: Payer: Self-pay | Admitting: Nurse Practitioner

## 2023-09-13 MED ORDER — AMPHETAMINE-DEXTROAMPHETAMINE 20 MG PO TABS: 20.0000 mg | ORAL_TABLET | Freq: Two times a day (BID) | ORAL | 0 refills | Status: DC

## 2023-10-10 ENCOUNTER — Other Ambulatory Visit: Payer: Self-pay | Admitting: Family Medicine

## 2023-10-10 ENCOUNTER — Other Ambulatory Visit: Payer: Self-pay | Admitting: Nurse Practitioner

## 2023-10-10 DIAGNOSIS — F419 Anxiety disorder, unspecified: Secondary | ICD-10-CM

## 2023-10-10 DIAGNOSIS — R41840 Attention and concentration deficit: Secondary | ICD-10-CM

## 2023-10-11 NOTE — Telephone Encounter (Signed)
 Last apt 05/27/23.

## 2023-10-12 ENCOUNTER — Encounter: Payer: Self-pay | Admitting: Nurse Practitioner

## 2023-10-12 DIAGNOSIS — R41840 Attention and concentration deficit: Secondary | ICD-10-CM

## 2023-10-12 MED ORDER — AMPHETAMINE-DEXTROAMPHETAMINE 20 MG PO TABS
20.0000 mg | ORAL_TABLET | Freq: Two times a day (BID) | ORAL | 0 refills | Status: DC
Start: 2023-10-12 — End: 2023-10-17

## 2023-10-17 MED ORDER — AMPHETAMINE-DEXTROAMPHETAMINE 20 MG PO TABS: 20.0000 mg | ORAL_TABLET | Freq: Two times a day (BID) | ORAL | 0 refills | Status: DC

## 2023-10-17 MED ORDER — AMPHETAMINE-DEXTROAMPHETAMINE 20 MG PO TABS
20.0000 mg | ORAL_TABLET | Freq: Two times a day (BID) | ORAL | 0 refills | Status: DC
Start: 2023-11-09 — End: 2024-01-31

## 2023-10-17 MED ORDER — AMPHETAMINE-DEXTROAMPHETAMINE 20 MG PO TABS
20.0000 mg | ORAL_TABLET | Freq: Two times a day (BID) | ORAL | 0 refills | Status: DC
Start: 2023-12-07 — End: 2024-01-31

## 2023-10-17 NOTE — Telephone Encounter (Signed)
 Please let Andreika know I have sent the next 3 prescriptions to the pharmacy requested in [place].

## 2023-10-28 ENCOUNTER — Other Ambulatory Visit: Payer: Self-pay | Admitting: Nurse Practitioner

## 2023-10-31 NOTE — Telephone Encounter (Signed)
 Last apt 05/27/23.

## 2023-12-06 ENCOUNTER — Encounter: Payer: Self-pay | Admitting: Nurse Practitioner

## 2024-01-02 ENCOUNTER — Other Ambulatory Visit: Payer: Self-pay | Admitting: Nurse Practitioner

## 2024-01-02 DIAGNOSIS — F419 Anxiety disorder, unspecified: Secondary | ICD-10-CM

## 2024-01-02 NOTE — Telephone Encounter (Signed)
 Last apt 05/27/23.

## 2024-01-30 ENCOUNTER — Encounter: Payer: Self-pay | Admitting: Nurse Practitioner

## 2024-01-31 ENCOUNTER — Other Ambulatory Visit: Payer: Self-pay | Admitting: Nurse Practitioner

## 2024-01-31 DIAGNOSIS — R41840 Attention and concentration deficit: Secondary | ICD-10-CM

## 2024-01-31 MED ORDER — AMPHETAMINE-DEXTROAMPHETAMINE 20 MG PO TABS
20.0000 mg | ORAL_TABLET | Freq: Two times a day (BID) | ORAL | 0 refills | Status: DC
Start: 2024-03-27 — End: 2024-04-02

## 2024-01-31 MED ORDER — AMPHETAMINE-DEXTROAMPHETAMINE 20 MG PO TABS: 20.0000 mg | ORAL_TABLET | Freq: Two times a day (BID) | ORAL | 0 refills | Status: DC

## 2024-03-02 ENCOUNTER — Telehealth: Payer: Self-pay | Admitting: *Deleted

## 2024-03-02 DIAGNOSIS — R41840 Attention and concentration deficit: Secondary | ICD-10-CM

## 2024-03-02 MED ORDER — AMPHETAMINE-DEXTROAMPHETAMINE 20 MG PO TABS: 20.0000 mg | ORAL_TABLET | Freq: Two times a day (BID) | ORAL | 0 refills | Status: DC

## 2024-03-02 NOTE — Telephone Encounter (Signed)
 Copied from CRM [id number]. Topic: Clinical - Prescription Issue >> Mar 02, 2024 11:18 AM Graeme ORN wrote: Reason for CRM: Patient called. States she is supposed to get amphetamine -dextroamphetamine  (ADDERALL) 20 MG tablet filled today. Unable to find anywhere that has it in stock. Checking to see if provider can be of any assistance or might have any suggestions. Thank You  I called [facility] in [place] and they have them in stock, can you please call in?

## 2024-03-02 NOTE — Telephone Encounter (Signed)
 Script sent to [facility] in [place]. Thank you.

## 2024-03-02 NOTE — Telephone Encounter (Signed)
 Called patient and let her know.

## 2024-03-07 ENCOUNTER — Other Ambulatory Visit (HOSPITAL_COMMUNITY): Payer: Self-pay

## 2024-03-08 ENCOUNTER — Other Ambulatory Visit (HOSPITAL_COMMUNITY): Payer: Self-pay

## 2024-03-08 ENCOUNTER — Telehealth: Payer: Self-pay

## 2024-03-08 NOTE — Telephone Encounter (Signed)
 Pharmacy Patient Advocate Encounter  Received notification from [facility] that Prior Authorization for Amphetamine -Dextroamphetamine  20MG  tablets has been APPROVED  to 8.28.26. Ran test claim, Copay is $6.98. This test claim was processed through [facility]- copay amounts may vary at other pharmacies due to pharmacy/plan contracts, or as the patient moves through the different stages of their insurance plan.   PA #/Case ID/Reference #: [id number] via latent

## 2024-03-14 DIAGNOSIS — M25461 Effusion, right knee: Secondary | ICD-10-CM | POA: Diagnosis not present

## 2024-03-14 DIAGNOSIS — M25561 Pain in right knee: Secondary | ICD-10-CM | POA: Diagnosis not present

## 2024-03-30 ENCOUNTER — Other Ambulatory Visit: Payer: Self-pay | Admitting: Nurse Practitioner

## 2024-03-30 DIAGNOSIS — R41840 Attention and concentration deficit: Secondary | ICD-10-CM

## 2024-03-30 NOTE — Telephone Encounter (Unsigned)
 Copied from CRM #[id number]. Topic: Clinical - Medication Refill >> Mar 30, 2024  2:05 PM Kevelyn M wrote: Medication: amphetamine -dextroamphetamine  (ADDERALL) 20 MG tablet  Has the patient contacted their pharmacy? Yes (Agent: If no, request that the patient contact the pharmacy for the refill. If patient does not wish to contact the pharmacy document the reason why and proceed with request.) (Agent: If yes, when and what did the pharmacy advise?)  This is the patient's preferred pharmacy:   [facility] # [address] [place], [place] - [address] [facility] [address] [place] [address] Phone: [phone] Fax: [phone]  Is this the correct pharmacy for this prescription? Yes If no, delete pharmacy and type the correct one.   Has the prescription been filled recently? No  Is the patient out of the medication? No  Has the patient been seen for an appointment in the last year OR does the patient have an upcoming appointment? Yes  Can we respond through MyChart? Yes  Agent: Please be advised that Rx refills may take up to 3 business days. We ask that you follow-up with your pharmacy.

## 2024-04-02 ENCOUNTER — Encounter: Payer: Self-pay | Admitting: Nurse Practitioner

## 2024-04-02 ENCOUNTER — Other Ambulatory Visit: Payer: Self-pay | Admitting: Nurse Practitioner

## 2024-04-02 DIAGNOSIS — R41840 Attention and concentration deficit: Secondary | ICD-10-CM

## 2024-04-02 MED ORDER — AMPHETAMINE-DEXTROAMPHETAMINE 20 MG PO TABS: 20.0000 mg | ORAL_TABLET | Freq: Two times a day (BID) | ORAL | 0 refills | Status: DC

## 2024-04-03 ENCOUNTER — Other Ambulatory Visit: Payer: Self-pay | Admitting: Nurse Practitioner

## 2024-04-03 DIAGNOSIS — F419 Anxiety disorder, unspecified: Secondary | ICD-10-CM

## 2024-04-03 NOTE — Telephone Encounter (Signed)
 Last apt 05/27/23 next apt 09/11/24.

## 2024-04-29 ENCOUNTER — Other Ambulatory Visit: Payer: Self-pay | Admitting: Nurse Practitioner

## 2024-04-29 DIAGNOSIS — F419 Anxiety disorder, unspecified: Secondary | ICD-10-CM

## 2024-06-26 ENCOUNTER — Encounter: Payer: Self-pay | Admitting: Nurse Practitioner

## 2024-06-27 MED ORDER — AMPHETAMINE-DEXTROAMPHETAMINE 20 MG PO TABS: 20.0000 mg | ORAL_TABLET | Freq: Two times a day (BID) | ORAL | 0 refills | Status: AC

## 2024-09-11 ENCOUNTER — Encounter: Payer: Self-pay | Admitting: Nurse Practitioner
